# Patient Record
Sex: Female | Born: 2000 | Race: Black or African American | Hispanic: No | Marital: Single | Smoking: Never smoker
Health system: Southern US, Community
[De-identification: ages and names within clinical notes are randomized; demographics above are authoritative.]

## PROBLEM LIST (undated history)

## (undated) DIAGNOSIS — J45909 Unspecified asthma, uncomplicated: Secondary | ICD-10-CM

## (undated) DIAGNOSIS — F909 Attention-deficit hyperactivity disorder, unspecified type: Secondary | ICD-10-CM

## (undated) HISTORY — PX: WISDOM TOOTH EXTRACTION: SHX21

---
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
## 2022-01-21 ENCOUNTER — Encounter: Payer: Self-pay | Admitting: Family

## 2022-01-27 ENCOUNTER — Telehealth: Payer: Self-pay

## 2022-01-27 NOTE — Telephone Encounter (Signed)
Patient's grandmother called stating that the patient (per provider instructions) has been trialing being off of her ADHD medication recently and the grandmother thinks that it is not going well. Per grandma, patient has become increasingly frustrated and agitated as of late and has been participating in school lectures that are 3 hours long that she is not receiving accomadations for. Royann Shivers says that she has spoken to the patient regarding beginning to take her medication again but that the patient has refused. Royann Shivers is requesting that a provider reach out to the patient and encourage her to start taking her medication again. Therapist, music.

## 2022-01-28 ENCOUNTER — Encounter: Payer: Self-pay | Admitting: Family

## 2022-01-28 NOTE — Telephone Encounter (Signed)
Left voice message for Grandmother to call Butch Penny on the nurse line @ [phone] opt 6.Marland Kitchen

## 2022-02-02 ENCOUNTER — Encounter: Payer: Self-pay | Admitting: Family

## 2022-02-02 ENCOUNTER — Telehealth (INDEPENDENT_AMBULATORY_CARE_PROVIDER_SITE_OTHER): Payer: Medicaid Other | Admitting: Family

## 2022-02-02 DIAGNOSIS — R269 Unspecified abnormalities of gait and mobility: Secondary | ICD-10-CM

## 2022-02-02 DIAGNOSIS — F902 Attention-deficit hyperactivity disorder, combined type: Secondary | ICD-10-CM | POA: Diagnosis not present

## 2022-02-02 NOTE — Progress Notes (Signed)
THIS RECORD MAY CONTAIN CONFIDENTIAL INFORMATION THAT SHOULD NOT BE RELEASED WITHOUT REVIEW OF THE SERVICE PROVIDER.  Virtual Follow-Up Visit via Video Note  I connected with Deanna Reilly  on 02/02/22 at  1:30 PM EDT by a video enabled telemedicine application and verified that I am speaking with the correct person using two identifiers.   Patient/parent location: dorm  Provider location: remote [place]   I discussed the limitations of evaluation and management by telemedicine and the availability of in person appointments.  I discussed that the purpose of this telehealth visit is to provide medical care while limiting exposure to the novel coronavirus.  The patient expressed understanding and agreed to proceed.   Deanna Reilly is a 21 y.o. female referred by Rae Lips, MD here today for follow-up of ADHD, combined type, right foot issue.    History was provided by the patient.  Supervising Physician: Dr. Roselind Messier   Plan from Last Visit:   Stopped meds: Intuniv 1 mg and Concerta 36 mg   Chief Complaint: R foot is not straight, walk is different  No concerns off meds   History of Present Illness:  -no heart palpitations, completely went away without taking medications  -now taking art classes, feels like she is not needing the medication with this class load  -sleep is about the same; usually sleeps around 10PM-7AM; sometimes will wake with hunger because of dinner being early around 6PM  -food pretty good; cafeteria kind of lacks in variety; doesn't change up foods often -took a little over a week for stomachache to subside  -since been at school, more consistent BMs than she used to; a little constipation this week  -had breakthrough bleeding, spotting one day  -feels like she has been having sleep paralysis; would know she should be awake but couldn't; getting some headaches; unsure frequency; a couple times in last few days; feels well-rested when  she wakes at 7AM; can never go back to sleep once she is awake  -leg: problem when she walks; never brought it up - left leg and foot walks straight; right leg is slightly out-turned about 15 degrees; if she tries to straighten it, it feels weird; never took notice until recently; no pain in leg or foot; just noticed it was strange   No Known Allergies Outpatient Medications Prior to Visit  Medication Sig Dispense Refill   CONCERTA 36 MG CR tablet Take 1 tablet (36 mg total) by mouth daily. 31 tablet 0   etonogestrel (NEXPLANON) 68 MG IMPL implant 1 each (68 mg total) by Subdermal route once. 1 each 0   guanFACINE (INTUNIV) 1 MG TB24 ER tablet Take 1 tablet (1 mg total) by mouth daily. 180 tablet 0   norethindrone (AYGESTIN) 5 MG tablet Take 1 tablet (5 mg total) by mouth daily. 90 tablet 3   polyethylene glycol powder (GLYCOLAX/MIRALAX) 17 GM/SCOOP powder Take 1-2 capfuls daily for 1 soft bowel movement daily 578 g 6   No facility-administered medications prior to visit.     Patient Active Problem List   Diagnosis Date Noted   Constipation 04/12/2020   Chronic midline thoracic back pain 11/06/2019   Absolute anemia 12/13/2017   ADHD (attention deficit hyperactivity disorder), combined type 09/20/2012   Generalized anxiety disorder 09/20/2012   Sleep disturbance 09/20/2012   The following portions of the patient's history were reviewed and updated as appropriate: allergies, current medications, past medical history, and problem list.  Visual Observations/Objective:   General Appearance:  Well nourished well developed, in no apparent distress.  Eyes: conjunctiva no swelling or erythema ENT/Mouth: No hoarseness, No cough for duration of visit.  Neck: Supple  Respiratory: Respiratory effort normal, normal rate, no retractions or distress.   Cardio: Appears well-perfused, noncyanotic Musculoskeletal: no obvious deformity Skin: visible skin without rashes, ecchymosis, erythema Neuro:  Awake and oriented X 3,  Psych:  normal affect, Insight and Judgment appropriate.    Assessment/Plan: 1. ADHD (attention deficit hyperactivity disorder), combined type -discussed other symptoms of ADHD including irritability and mood changes -plan to log symptoms she describes as sleep paralysis for further evaluation  -consider Neuro or sleep study depending on symptom log  2. Gait abnormality -no antecedent trauma or pain -requesting referral to Ortho for evaluation - Ambulatory referral to Orthopedic Surgery   I discussed the assessment and treatment plan with the patient and/or parent/guardian.  They were provided an opportunity to ask questions and all were answered.  They agreed with the plan and demonstrated an understanding of the instructions. They were advised to call back or seek an in-person evaluation in the emergency room if the symptoms worsen or if the condition fails to improve as anticipated.   Follow-up:   PRN    Georges Mouse, NP    CC: Kalman Jewels, MD, Kalman Jewels, MD

## 2022-02-04 ENCOUNTER — Encounter: Payer: Self-pay | Admitting: Family

## 2022-02-05 ENCOUNTER — Ambulatory Visit: Payer: Medicaid Other | Admitting: Surgery

## 2022-02-12 ENCOUNTER — Ambulatory Visit (INDEPENDENT_AMBULATORY_CARE_PROVIDER_SITE_OTHER): Payer: Medicaid Other | Admitting: Surgery

## 2022-02-12 ENCOUNTER — Encounter: Payer: Self-pay | Admitting: Surgery

## 2022-02-12 DIAGNOSIS — R269 Unspecified abnormalities of gait and mobility: Secondary | ICD-10-CM | POA: Diagnosis not present

## 2022-02-25 NOTE — Progress Notes (Signed)
Office Visit Note   Patient: Deanna Reilly           Date of Birth: 14-Jun-2000           MRN: [id number] Visit Date: 02/12/2022              Requested by: Georges Mouse, NP [address] [place],  [place] [address] PCP: Kalman Jewels, MD   Assessment & Plan: Visit Diagnoses:  1. Abnormal gait     Plan: Advised patient today I do not see any gait abnormality.  Exam is benign.  I will schedule her for formal PT and they can do a better assessment of her gait pattern but again I reassured her that I do not see anything abnormal.  Follow-up in 2 months for recheck.  Follow-Up Instructions: Return in about 2 months (around 04/14/2022) for with [facility] for recheck after PT.   Orders:  Orders Placed This Encounter  Procedures   Ambulatory referral to Physical Therapy   No orders of the defined types were placed in this encounter.     Procedures: No procedures performed   Clinical Data: No additional findings.   Subjective: Chief Complaint  Patient presents with   Right Foot - Pain    HPI 21 year old white female was referred to our office for evaluation of abnormal gait.  Patient states that she has been noticing that her right foot is turned out when she walks.  She does not complain of any back pain or lower extremity pain.  No limitation in activities. Review of Systems No current complaints of cardiopulmonary GI/GU issues  Objective: Vital Signs: There were no vitals taken for this visit.  Physical Exam Constitutional:      Appearance: Normal appearance.  HENT:     Head: Normocephalic and atraumatic.     Nose: Nose normal.  Eyes:     Extraocular Movements: Extraocular movements intact.  Pulmonary:     Effort: No respiratory distress.  Musculoskeletal:     Comments: Gait is normal.  Lumbar spine unremarkable.  Negative logroll bilateral hips.  Negative straight leg raise.  No focal motor deficits.  I do not see any signs of deformity.   Neurological:     General: No focal deficit present.     Mental Status: She is alert and oriented to person, place, and time.     Gait: Gait normal.  Psychiatric:        Mood and Affect: Mood normal.     Ortho Exam  Specialty Comments:  No specialty comments available.  Imaging: No results found.   PMFS History: Patient Active Problem List   Diagnosis Date Noted   Constipation 04/12/2020   Chronic midline thoracic back pain 11/06/2019   Absolute anemia 12/13/2017   ADHD (attention deficit hyperactivity disorder), combined type 09/20/2012   Generalized anxiety disorder 09/20/2012   Sleep disturbance 09/20/2012   Past Medical History:  Diagnosis Date   ADHD    Asthma     Family History  Adopted: Yes    History reviewed. No pertinent surgical history. Social History   Occupational History   Not on file  Tobacco Use   Smoking status: Never   Smokeless tobacco: Never   Tobacco comments:    only rarely when dad visits. no smoke exp on daily basis.  Substance and Sexual Activity   Alcohol use: No    Alcohol/week: 0.0 standard drinks of alcohol   Drug use: No  Sexual activity: Never

## 2022-03-02 DIAGNOSIS — F913 Oppositional defiant disorder: Secondary | ICD-10-CM | POA: Diagnosis not present

## 2022-03-12 DIAGNOSIS — F913 Oppositional defiant disorder: Secondary | ICD-10-CM | POA: Diagnosis not present

## 2022-03-17 DIAGNOSIS — F913 Oppositional defiant disorder: Secondary | ICD-10-CM | POA: Diagnosis not present

## 2022-03-25 DIAGNOSIS — F913 Oppositional defiant disorder: Secondary | ICD-10-CM | POA: Diagnosis not present

## 2022-03-30 DIAGNOSIS — F913 Oppositional defiant disorder: Secondary | ICD-10-CM | POA: Diagnosis not present

## 2022-04-08 DIAGNOSIS — F913 Oppositional defiant disorder: Secondary | ICD-10-CM | POA: Diagnosis not present

## 2022-04-16 DIAGNOSIS — F913 Oppositional defiant disorder: Secondary | ICD-10-CM | POA: Diagnosis not present

## 2022-04-22 DIAGNOSIS — F913 Oppositional defiant disorder: Secondary | ICD-10-CM | POA: Diagnosis not present

## 2022-04-23 ENCOUNTER — Other Ambulatory Visit: Payer: Self-pay | Admitting: Family

## 2022-04-23 ENCOUNTER — Encounter: Payer: Self-pay | Admitting: Family

## 2022-04-23 DIAGNOSIS — N926 Irregular menstruation, unspecified: Secondary | ICD-10-CM

## 2022-04-27 DIAGNOSIS — F913 Oppositional defiant disorder: Secondary | ICD-10-CM | POA: Diagnosis not present

## 2022-05-06 ENCOUNTER — Encounter: Payer: Self-pay | Admitting: Family

## 2022-05-11 DIAGNOSIS — F913 Oppositional defiant disorder: Secondary | ICD-10-CM | POA: Diagnosis not present

## 2022-05-14 ENCOUNTER — Encounter: Payer: Self-pay | Admitting: Family

## 2022-05-19 DIAGNOSIS — F913 Oppositional defiant disorder: Secondary | ICD-10-CM | POA: Diagnosis not present

## 2022-05-28 DIAGNOSIS — F913 Oppositional defiant disorder: Secondary | ICD-10-CM | POA: Diagnosis not present

## 2022-06-07 ENCOUNTER — Ambulatory Visit: Payer: Medicaid Other | Admitting: Family

## 2022-07-12 ENCOUNTER — Ambulatory Visit (INDEPENDENT_AMBULATORY_CARE_PROVIDER_SITE_OTHER): Payer: Medicaid Other | Admitting: Family

## 2022-07-12 ENCOUNTER — Encounter: Payer: Self-pay | Admitting: Family

## 2022-07-12 VITALS — BP 129/76 | HR 70 | Ht 61.52 in | Wt 145.6 lb

## 2022-07-12 DIAGNOSIS — E559 Vitamin D deficiency, unspecified: Secondary | ICD-10-CM

## 2022-07-12 DIAGNOSIS — Z975 Presence of (intrauterine) contraceptive device: Secondary | ICD-10-CM

## 2022-07-12 DIAGNOSIS — Z8349 Family history of other endocrine, nutritional and metabolic diseases: Secondary | ICD-10-CM

## 2022-07-12 DIAGNOSIS — N921 Excessive and frequent menstruation with irregular cycle: Secondary | ICD-10-CM

## 2022-07-12 DIAGNOSIS — Z113 Encounter for screening for infections with a predominantly sexual mode of transmission: Secondary | ICD-10-CM

## 2022-07-12 NOTE — Progress Notes (Signed)
History was provided by the patient.  She is here for breakthrough bleeding with Nexplanon.   PCP confirmed? Yes.    Rae Lips, MD  HPI:   -has been taking Aygestin 5 mg on with Nexplanon to avoid spotting -had bleeding almost the entire month of December and January  -none since that time except for some spotting  -does not want to have period -mom has hx of thyroid issues  -no pain or cramping at this time, no bleeding  -is not taking ADHD medications  -changed major from art to psychology   Patient Active Problem List   Diagnosis Date Noted   Constipation 04/12/2020   Chronic midline thoracic back pain 11/06/2019   Absolute anemia 12/13/2017   ADHD (attention deficit hyperactivity disorder), combined type 09/20/2012   Generalized anxiety disorder 09/20/2012   Sleep disturbance 09/20/2012    Current Outpatient Medications on File Prior to Visit  Medication Sig Dispense Refill   norethindrone (AYGESTIN) 5 MG tablet TAKE 1 TABLET (5 MG TOTAL) BY MOUTH DAILY. 90 tablet 3   polyethylene glycol powder (GLYCOLAX/MIRALAX) 17 GM/SCOOP powder Take 1-2 capfuls daily for 1 soft bowel movement daily [id number] g 6   CONCERTA 36 MG CR tablet Take 1 tablet (36 mg total) by mouth daily. (Patient not taking: Reported on 07/12/2022) 31 tablet 0   etonogestrel (NEXPLANON) 68 MG IMPL implant 1 each (68 mg total) by Subdermal route once. 1 each 0   guanFACINE (INTUNIV) 1 MG TB24 ER tablet Take 1 tablet (1 mg total) by mouth daily. (Patient not taking: Reported on 07/12/2022) 180 tablet 0   No current facility-administered medications on file prior to visit.    No Known Allergies  Physical Exam:    Vitals:   07/12/22 1428  BP: 129/76  Pulse: 70  Weight: 145 lb 9.6 oz (66 kg)  Height: 5' 1.52" (1.563 m)    Blood pressure %iles are not available for patients who are 18 years or older. No LMP recorded.  Physical Exam Constitutional:      General: She is not in acute distress.     Appearance: She is well-developed.  HENT:     Head: Normocephalic and atraumatic.  Eyes:     General: No scleral icterus.    Pupils: Pupils are equal, round, and reactive to light.  Neck:     Thyroid: No thyromegaly.  Cardiovascular:     Rate and Rhythm: Normal rate and regular rhythm.     Heart sounds: Normal heart sounds. No murmur heard. Pulmonary:     Effort: Pulmonary effort is normal.     Breath sounds: Normal breath sounds.  Musculoskeletal:        General: Normal range of motion.     Cervical back: Normal range of motion and neck supple.  Lymphadenopathy:     Cervical: No cervical adenopathy.  Skin:    General: Skin is warm and dry.     Findings: No rash.  Neurological:     Mental Status: She is alert and oriented to person, place, and time.     Cranial Nerves: No cranial nerve deficit.     Motor: No tremor.  Psychiatric:        Attention and Perception: Attention normal.        Mood and Affect: Mood normal.        Speech: Speech normal.        Behavior: Behavior normal.        Thought Content: Thought content  normal.        Judgment: Judgment normal.      Assessment/Plan:  -implant since 2021; now good for 5 years -will assess other etiologies to explain recent bleeding changes including thyroid (due to family hx) and infections. Also addressed that most common side effect with implant is unpredictable bleeding and also discussed withdrawal bleeding associated with norethindrone missed or late dosing. Video in one week to review labs or sooner if needed.   1. Breakthrough bleeding on Nexplanon - TSH + free T4 - Prolactin - CBC with Differential/Platelet - Comprehensive metabolic panel - Hemoglobin A1c - Lipid panel  2. Family history of thyroid disease in mother - TSH + free T4  3. Vitamin D deficiency - VITAMIN D 25 Hydroxy (Vit-D Deficiency, Fractures)  4. Routine screening for STI (sexually transmitted infection) - C. trachomatis/N. gonorrhoeae  RNA

## 2022-07-13 ENCOUNTER — Other Ambulatory Visit: Payer: Self-pay | Admitting: Family

## 2022-07-13 DIAGNOSIS — R7309 Other abnormal glucose: Secondary | ICD-10-CM

## 2022-07-13 LAB — CBC WITH DIFFERENTIAL/PLATELET
Absolute Monocytes: 612 cells/uL (ref 200–950)
Basophils Absolute: 29 cells/uL (ref 0–200)
Basophils Relative: 0.4 %
Eosinophils Absolute: 144 cells/uL (ref 15–500)
Eosinophils Relative: 2 %
HCT: 39 % (ref 35.0–45.0)
Hemoglobin: 12.7 g/dL (ref 11.7–15.5)
Lymphs Abs: 3088.8 cells/uL (ref 850–3900)
MCH: 27.7 pg (ref 27.0–33.0)
MCHC: 32.6 g/dL (ref 32.0–36.0)
MCV: 85.2 fL (ref 80.0–100.0)
MPV: 11 fL (ref 7.5–12.5)
Monocytes Relative: 8.5 %
Neutro Abs: 3326 cells/uL (ref 1500–7800)
Neutrophils Relative %: 46.2 %
Platelets: 268 10*3/uL (ref 140–400)
RBC: 4.58 10*6/uL (ref 3.80–5.10)
RDW: 12.5 % (ref 11.0–15.0)
Total Lymphocyte: 42.9 %
WBC: 7.2 10*3/uL (ref 3.8–10.8)

## 2022-07-13 LAB — COMPREHENSIVE METABOLIC PANEL
AG Ratio: 1.5 (calc) (ref 1.0–2.5)
ALT: 8 U/L (ref 5–32)
AST: 12 U/L (ref 12–32)
Albumin: 4.4 g/dL (ref 3.6–5.1)
Alkaline phosphatase (APISO): 75 U/L (ref 36–128)
BUN: 10 mg/dL (ref 7–20)
CO2: 25 mmol/L (ref 20–32)
Calcium: 9.4 mg/dL (ref 8.9–10.4)
Chloride: 106 mmol/L (ref 98–110)
Creat: 0.84 mg/dL (ref 0.50–0.96)
Globulin: 2.9 g/dL (calc) (ref 2.0–3.8)
Glucose, Bld: 80 mg/dL (ref 65–99)
Potassium: 3.9 mmol/L (ref 3.8–5.1)
Sodium: 140 mmol/L (ref 135–146)
Total Bilirubin: 0.3 mg/dL (ref 0.2–1.1)
Total Protein: 7.3 g/dL (ref 6.3–8.2)

## 2022-07-13 LAB — LIPID PANEL
Cholesterol: 140 mg/dL (ref <170)
HDL: 39 mg/dL — ABNORMAL LOW (ref >45)
LDL Cholesterol (Calc): 82 mg/dL (calc) (ref <110)
Non-HDL Cholesterol (Calc): 101 mg/dL (calc) (ref <120)
Total CHOL/HDL Ratio: 3.6 (calc) (ref <5.0)
Triglycerides: 91 mg/dL — ABNORMAL HIGH (ref <90)

## 2022-07-13 LAB — C. TRACHOMATIS/N. GONORRHOEAE RNA
C. trachomatis RNA, TMA: NOT DETECTED
N. gonorrhoeae RNA, TMA: NOT DETECTED

## 2022-07-13 LAB — VITAMIN D 25 HYDROXY (VIT D DEFICIENCY, FRACTURES): Vit D, 25-Hydroxy: 14 ng/mL — ABNORMAL LOW (ref 30–100)

## 2022-07-13 LAB — HEMOGLOBIN A1C
Hgb A1c MFr Bld: 6 % of total Hgb — ABNORMAL HIGH (ref <5.7)
Mean Plasma Glucose: 126 mg/dL
eAG (mmol/L): 7 mmol/L

## 2022-07-13 LAB — TSH+FREE T4: TSH W/REFLEX TO FT4: 2.1 mIU/L

## 2022-07-13 LAB — PROLACTIN: Prolactin: 12.5 ng/mL

## 2022-07-13 MED ORDER — VITAMIN D (ERGOCALCIFEROL) 1.25 MG (50000 UNIT) PO CAPS: 50000.0000 [IU] | ORAL_CAPSULE | ORAL | 0 refills | Status: DC

## 2022-07-21 ENCOUNTER — Telehealth (INDEPENDENT_AMBULATORY_CARE_PROVIDER_SITE_OTHER): Payer: Medicaid Other | Admitting: Family

## 2022-07-21 ENCOUNTER — Encounter: Payer: Self-pay | Admitting: Family

## 2022-07-21 DIAGNOSIS — R7309 Other abnormal glucose: Secondary | ICD-10-CM | POA: Diagnosis not present

## 2022-07-21 DIAGNOSIS — E559 Vitamin D deficiency, unspecified: Secondary | ICD-10-CM | POA: Diagnosis not present

## 2022-07-21 DIAGNOSIS — Z975 Presence of (intrauterine) contraceptive device: Secondary | ICD-10-CM | POA: Diagnosis not present

## 2022-07-21 DIAGNOSIS — F902 Attention-deficit hyperactivity disorder, combined type: Secondary | ICD-10-CM

## 2022-07-21 DIAGNOSIS — N921 Excessive and frequent menstruation with irregular cycle: Secondary | ICD-10-CM

## 2022-07-21 MED ORDER — CONCERTA 36 MG PO TBCR
36.0000 mg | EXTENDED_RELEASE_TABLET | Freq: Every day | ORAL | 0 refills | Status: DC
Start: 2022-07-21 — End: 2023-01-14

## 2022-07-21 MED ORDER — GUANFACINE HCL ER 1 MG PO TB24
1.0000 mg | ORAL_TABLET | Freq: Every day | ORAL | 0 refills | Status: DC
Start: 2022-07-21 — End: 2023-01-14

## 2022-07-21 NOTE — Progress Notes (Signed)
THIS RECORD MAY CONTAIN CONFIDENTIAL INFORMATION THAT SHOULD NOT BE RELEASED WITHOUT REVIEW OF THE SERVICE PROVIDER.  Virtual Follow-Up Visit via Video Note  I connected with Deanna Reilly  on 07/21/22 at  2:30 PM EDT by a video enabled telemedicine application and verified that I am speaking with the correct person using two identifiers.   Patient/parent location: dorm, Control and instrumentation engineer location: remote [place]    I discussed the limitations of evaluation and management by telemedicine and the availability of in person appointments.  I discussed that the purpose of this telehealth visit is to provide medical care while limiting exposure to the novel coronavirus.  The patient expressed understanding and agreed to proceed.   Deanna Reilly is a 22 y.o. female referred by Kalman Jewels, MD here today for follow-up of elevated A1c, breakthrough bleeding with implant.   History was provided by the patient.  Supervising Physician: Dr. Theadore Nan   Plan from Last Visit:   -implant since 2021; now good for 5 years -will assess other etiologies to explain recent bleeding changes including thyroid (due to family hx) and infections. Also addressed that most common side effect with implant is unpredictable bleeding and also discussed withdrawal bleeding associated with norethindrone missed or late dosing. Video in one week to review labs or sooner if needed.    1. Breakthrough bleeding on Nexplanon - TSH + free T4 - Prolactin - CBC with Differential/Platelet - Comprehensive metabolic panel - Hemoglobin A1c - Lipid panel   2. Family history of thyroid disease in mother - TSH + free T4   3. Vitamin D deficiency - VITAMIN D 25 Hydroxy (Vit-D Deficiency, Fractures)   4. Routine screening for STI (sexually transmitted infection) - C. trachomatis/N. gonorrhoeae RNA  Chief Complaint: Elevated A1c 6.0  Low vitamin D   History of Present Illness:  -Sunday night had  some cramping, no bleeding  -woke up and noticed her spotting had come on  -had some spotting again this morning -did not bleed at all yesterday  -went to gym and started walking  -school food choices are burgers, hot dogs, pizza, pastas - tries to not eat too much  -picked up vitamin d supplementation, started Monday  -plans to keep taking Aygestin until school is out for bleeding and then will see what cycle does without it over the summer  -not taking ADHD medications now but may consider taking them again.  -no other concerns or questions today   No Known Allergies Outpatient Medications Prior to Visit  Medication Sig Dispense Refill   CONCERTA 36 MG CR tablet Take 1 tablet (36 mg total) by mouth daily. (Patient not taking: Reported on 07/12/2022) 31 tablet 0   etonogestrel (NEXPLANON) 68 MG IMPL implant 1 each (68 mg total) by Subdermal route once. 1 each 0   guanFACINE (INTUNIV) 1 MG TB24 ER tablet Take 1 tablet (1 mg total) by mouth daily. (Patient not taking: Reported on 07/12/2022) 180 tablet 0   norethindrone (AYGESTIN) 5 MG tablet TAKE 1 TABLET (5 MG TOTAL) BY MOUTH DAILY. 90 tablet 3   polyethylene glycol powder (GLYCOLAX/MIRALAX) 17 GM/SCOOP powder Take 1-2 capfuls daily for 1 soft bowel movement daily 578 g 6   Vitamin D, Ergocalciferol, (DRISDOL) 1.25 MG (50000 UNIT) CAPS capsule Take 1 capsule (50,000 Units total) by mouth every 7 (seven) days. 8 capsule 0   No facility-administered medications prior to visit.     Patient Active Problem List   Diagnosis Date Noted  Constipation 04/12/2020   Chronic midline thoracic back pain 11/06/2019   Absolute anemia 12/13/2017   ADHD (attention deficit hyperactivity disorder), combined type 09/20/2012   Generalized anxiety disorder 09/20/2012   Sleep disturbance 09/20/2012     The following portions of the patient's history were reviewed and updated as appropriate: allergies, current medications, past family history, past medical  history, past social history, past surgical history, and problem list.  Visual Observations/Objective:   General Appearance: Well nourished well developed, in no apparent distress.  Eyes: conjunctiva no swelling or erythema ENT/Mouth: No hoarseness, No cough for duration of visit.  Neck: Supple  Respiratory: Respiratory effort normal, normal rate, no retractions or distress.   Cardio: Appears well-perfused, noncyanotic Musculoskeletal: no obvious deformity Skin: visible skin without rashes, ecchymosis, erythema Neuro: Awake and oriented X 3,  Psych:  normal affect, Insight and Judgment appropriate.    Assessment/Plan: 1. Breakthrough bleeding on Nexplanon -reviewed labs - thyroid and prolactin WNL  -hemoglobin is 12.7 -continue with Aygestin for BTB; return precautions reviewed   2. Elevated hemoglobin A1c -scheduled for RD appointment 6/5 -reviewed lifestyle modifications including daily body movements, increase walking; limit fatty foods, increase fresh fruits and vegetables; cholesterol is WNL.   3. Vitamin D deficiency -continue high dose vitamin D (14)   Repeat A1c in 3 months.  Refills for ADHD medications sent today; she may consider restarting them.   I discussed the assessment and treatment plan with the patient and/or parent/guardian.  They were provided an opportunity to ask questions and all were answered.  They agreed with the plan and demonstrated an understanding of the instructions. They were advised to call back or seek an in-person evaluation in the emergency room if the symptoms worsen or if the condition fails to improve as anticipated.   Follow-up:   3 months or sooner if needed   Georges Mouse, NP    CC: Kalman Jewels, MD, Kalman Jewels, MD

## 2022-07-29 ENCOUNTER — Encounter: Payer: Self-pay | Admitting: *Deleted

## 2022-09-15 ENCOUNTER — Encounter: Payer: Medicaid Other | Attending: Family | Admitting: Dietician

## 2022-09-15 ENCOUNTER — Encounter: Payer: Self-pay | Admitting: Dietician

## 2022-09-15 VITALS — Ht 61.5 in | Wt 152.0 lb

## 2022-09-15 DIAGNOSIS — R7309 Other abnormal glucose: Secondary | ICD-10-CM

## 2022-09-15 NOTE — Patient Instructions (Addendum)
Goal: Go walking 4-5 days per week for 15 minutes.  Goal: Aim to make your plate look like MyPlate at least once per day.   Goal: Aim to drink 2 water bottles daily (we continue to work up from there).   Rethink what you drink. Choose beverages without added sugar. Look for 0 carbs on the label.

## 2022-09-15 NOTE — Progress Notes (Signed)
Medical Nutrition Therapy  Appointment Start time:  1450  Appointment End time:  1542  Primary concerns today: Pt is concerned about her A1c level.    Referral diagnosis: elevated hemoglobin a1c Preferred learning style: no preference indicated Learning readiness: ready   NUTRITION ASSESSMENT   Anthropometrics  Ht: 61.5 in Wt: 152 lbs  Clinical Medical Hx: vitamin D deficiency, prediabetes, asthma Medications: reviewed Labs: 07/12/22 A1c 6.0%, HDL 39, Trig 91, Vitamin D 14.  Notable Signs/Symptoms: constipation and diarrhea Food Allergies: none reported  Lifestyle & Dietary Hx  Pt is present today with her grandma.   Pt states sometimes she goes a week without a bowel movement and then she will take miralax. She states in the last few days she has experienced some diarrhea.   Pt states she drinks one 16 oz bottle of water daily with her medication but otherwise does not drink any because she does not like it.   Pt states her grandma cooks rarely and states she does not know how to cook much so they mostly get takeout food.   Pt states she gets heartburn after dinner sometimes.   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   Pt state before the school year ended she was walking to the gym and working out but has since stopped since she is not on campus anymore.   Preferred vegetables: kale, asparagus, spinach, broccoli, cauliflower, cucumber, green beans, collards, onion, zucchini.   Estimated daily fluid intake: 16 oz Supplements: vitamin D Sleep: 8 hours Stress / self-care: low stress Current average weekly physical activity: ADLs  24-Hr Dietary Recall First Meal: 2 buttered waffles OR 2 toast with butter Snack: white cheddar cheeto puffs OR chips Second Meal: mcdonalds OR wendys OR sandwich OR tater tots Snack: white cheddar cheeto puffs OR chips Third Meal: salmon with rice and broccoli OR hibachi vegetables and rice OR veggie lo mein Snack:  croissant Beverages: 16 oz, 1-2 cups cran-mango juice, 1 soda mini can    NUTRITION DIAGNOSIS  -2.2 Altered nutrition-related laboratory As related to prediabetes.  As evidenced by A1c 6.0.   NUTRITION INTERVENTION  Nutrition education (E-1) on the following topics:  Fruits & Vegetables: Aim to fill half your plate with a variety of fruits and vegetables. They are rich in vitamins, minerals, and fiber, and can help reduce the risk of chronic diseases. Choose a colorful assortment of fruits and vegetables to ensure you get a wide range of nutrients. Grains and Starches: Make at least half of your grain choices whole grains, such as brown rice, whole wheat bread, and oats. Whole grains provide fiber, which aids in digestion and healthy cholesterol levels. Aim for whole forms of starchy vegetables such as potatoes, sweet potatoes, beans, peas, and corn, which are fiber rich and provide many vitamins and minerals.  Protein: Incorporate lean sources of protein, such as poultry, fish, beans, nuts, and seeds, into your meals. Protein is essential for building and repairing tissues, staying full, balancing blood sugar, as well as supporting immune function. Dairy: Include low-fat or fat-free dairy products like milk, yogurt, and cheese in your diet. Dairy foods are excellent sources of calcium and vitamin D, which are crucial for bone health.   Prediabetes: Prediabetes is a condition where blood sugar levels are higher than normal but not yet high enough to be diagnosed as type 2 diabetes. A1C, or hemoglobin A1c, is a blood test that provides an average of a person's blood sugar levels over the  past two to three months. It is commonly used to diagnose and monitor diabetes. For prediabetes, an A1C level between 5.7% and 6.4% typically is used to diagnose this. Here is how the A1C levels are generally categorized: Normal:  A1C below 5.7% Prediabetes:  A1C between 5.7% and 6.4% Diabetes:  A1C of 6.5% or  higher When diagnosed with prediabetes, there are several lifestyle changes you can make to manage the condition: Healthy Eating:  Follow a well-balanced diet that includes a variety of fruits, vegetables, whole grains, lean proteins, and healthy fats. Monitor portion sizes and reduce intake of sugary and processed foods. Regular Physical Activity:  Engage in regular physical activity, such as brisk walking, cycling, or other aerobic exercises, for at least 150 minutes per week. Include strength training exercises at least twice a week.  Handouts Provided Include  Plate Method  Learning Style & Readiness for Change Teaching method utilized: Visual & Auditory  Demonstrated degree of understanding via: Teach Back  Barriers to learning/adherence to lifestyle change: none  Goals Established by Pt  Goal: Go walking 4-5 days per week for 15 minutes.  Goal: Aim to make your plate look like MyPlate at least once per day.   Goal: Aim to drink 2 water bottles daily (we continue to work up from there).   Rethink what you drink. Choose beverages without added sugar. Look for 0 carbs on the label.   MONITORING & EVALUATION Dietary intake, weekly physical activity, and follow up PRN.  Next Steps  Patient is to call for questions.

## 2022-10-20 DIAGNOSIS — F913 Oppositional defiant disorder: Secondary | ICD-10-CM | POA: Diagnosis not present

## 2022-10-30 ENCOUNTER — Encounter: Payer: Self-pay | Admitting: Family

## 2022-10-31 ENCOUNTER — Other Ambulatory Visit: Payer: Self-pay | Admitting: Family

## 2022-10-31 MED ORDER — POLYETHYLENE GLYCOL 3350 17 GM/SCOOP PO POWD: ORAL | 6 refills | Status: DC

## 2022-11-17 DIAGNOSIS — F913 Oppositional defiant disorder: Secondary | ICD-10-CM | POA: Diagnosis not present

## 2022-12-15 ENCOUNTER — Encounter: Payer: Self-pay | Admitting: Student

## 2022-12-15 ENCOUNTER — Ambulatory Visit: Payer: Medicaid Other | Admitting: Student

## 2022-12-15 VITALS — BP 118/64 | HR 74 | Ht 61.65 in | Wt 155.5 lb

## 2022-12-15 DIAGNOSIS — Z3202 Encounter for pregnancy test, result negative: Secondary | ICD-10-CM

## 2022-12-15 DIAGNOSIS — R0602 Shortness of breath: Secondary | ICD-10-CM

## 2022-12-15 LAB — POCT URINE PREGNANCY: Preg Test, Ur: NEGATIVE

## 2022-12-15 LAB — POCT HEMOGLOBIN: Hemoglobin: 10.9 g/dL — AB (ref 11–14.6)

## 2022-12-15 MED ORDER — ALBUTEROL SULFATE HFA 108 (90 BASE) MCG/ACT IN AERS: 2.0000 | INHALATION_SPRAY | Freq: Four times a day (QID) | RESPIRATORY_TRACT | 2 refills | Status: DC | PRN

## 2022-12-15 NOTE — Patient Instructions (Addendum)
Staying hydrated means making sure that your body has enough fluids to work well. It is especially important to stay hydrated when you exercise and play sports. This keeps you from losing more fluids than you take in (dehydration). Dehydration is more likely to happen if you exercise very hard and sweat a lot. You can keep yourself hydrated during sports and other activities by: Drinking enough water before, during, and after physical activity. Watching out for signs of dehydration. Why is staying hydrated important? Staying hydrated is important because your body contains mostly water. Your body needs fluid to work properly. Drinking enough fluid helps your body: Carry important nutrients, oxygen, and blood to all the cells in your body. Perform at its best athletically by: Maintaining a proper body temperature (thermoregulation). Keeping your joints moving smoothly. Helping your heart pump blood more efficiently. Making your muscles work better. Staying hydrated is also important because it helps prevent dehydration. Severe dehydration can be a medical emergency. How can I know if I am not hydrated enough? Checking the color of your urine is the easiest way to tell if you are hydrated enough. Dark yellow urine shows that you need more fluids. Your urine should be pale yellow. Watch for signs of dehydration. These include: Feeling thirsty. Dry lips or dry mouth. Urinating less often or having very dark urine. Muscle cramps. Headache. Fast heartbeat. Dizziness. Feeling very tired. Not having tears when crying. You can also weigh yourself before and after being physically active. The difference in your weight shows how much fluid you lost. For every pound lost, drink 16-24 oz of water to prevent dehydration. What actions can I take to stay hydrated? The best way to stay hydrated is by drinking water before, during, and after exercising and playing sports. In general, you should  drink: 16-20 oz of water 2-3 hours before being active. 8 oz of water 30 minutes before being active. 4-8 oz of water every 15-20 minutes during physical activity. 8 oz of water within 30 minutes after you finish being active. Drinking water is usually enough to keep you hydrated. Most teens do not need sports drinks. You may need a sports drink if you exercise or play outdoors in extremely hot weather for longer than one hour. Choose a sports drink that does not have caffeine or a lot of sugar. Get help right away if you have: Signs of heat exhaustion or heatstroke, including: Headache. Weakness and fatigue. Nausea or vomiting. Passing out. Fast heartbeat. Fast breathing. Body temperature higher than 104F (40C). Feeling confused. Seizures. Signs of severe dehydration, such as: Feeling weak and confused. Dry skin. Not urinating. Passing out. Signs of too little salt (sodium) in the body, such as: Swollen hands and feet. Feeling confused. Vomiting. Headache. These symptoms may be an emergency. Get help right away. Call 911. Do not wait to see if the symptoms will go away. Do not drive yourself to the hospital. Summary It is important to stay hydrated when doing physical activity so that you do not get dehydrated. Drinking enough fluid to stay hydrated can help you perform your best athletically and prevent dehydration. You can check to see if you are hydrated enough by checking the color of your urine, watching for signs of dehydration, and weighing yourself before and after physical activity. Drinking water is usually enough to keep you hydrated. Most teens do not need sports drinks. This information is not intended to replace advice given to you by your health care provider. Make  sure you discuss any questions you have with your health care provider. Document Revised: 07/28/2021 Document Reviewed: 07/28/2021 Elsevier Patient Education  2024 [employer].  Iron Rich  Foods  Give foods that are high in iron such as meats, fish, beans, eggs, dark leafy greens (kale, spinach), and fortified cereals (Cheerios, Oatmeal Squares, Mini Wheats).    Eating these foods along with a food containing vitamin C (such as oranges or strawberries) helps the body to absorb the iron.   Give an infant multivitamin with iron such as Poly-vi-sol with iron daily.  This doesn't taste great and can stain skin and clothing.  Consider giving it during the bath.  Novaferrum Pediatric Drops Multivitamin with Iron is a better-tasting alternative that you can order on the internet.  For children older than age 79, give a chewable multivitamin with iron (such as Flintstones with Iron) one vitamin daily.  Milk is very nutritious, but limit the amount of milk to no more than 16-20 oz per day.   Best Cereal Choices: Contain 90-100% of daily recommended iron.   All flavors of Oatmeal Squares, Multi-grain cheerios, and Mini Wheats are high in iron.         Next best cereal choices: Contain 45-50% of daily recommended iron.  Original cheerios are high in iron - other flavors are not.   Original Rice Krispies and original Kix are also high in iron, other flavors are not.

## 2022-12-15 NOTE — Progress Notes (Signed)
PCP: Kalman Jewels, MD   Chief Complaint  Patient presents with   Shortness of Breath    PATIENT STATES FOR THE LAST COUPLE OF DAYS SHE HAS BEEN HAVING SHORTNESS OF BREATH SOME NAUSEA AND DIZZINESS NO FEVER        Subjective:  HPI:  Deanna Reilly is a 22 y.o. female with PMHx of asthma requiring albuterol who is experiencing dizziness, lightheadedness, nausea, headache which has worsened at night over the last few weeks. During the day until 3pm, she does not notice it. After classes end and when she's in her room working on homework, she notices the onset. Ibuprofen helps with her headache. Does feel like laying down helps. She feels short of breath walking to class when outdoors and in the heat, and like she's not getting enough breath for lungs to be satisfied. Did feel similarly out of breath when she was anemic. No cough, wheeze, stridor, chest tightness. No tingling of arms or hands.   She goes to bed at 11-12pm and wakes up at 7-8am. Does take longer naps in the afternoon.   She tries to eat breakfast. Occasionally will skip breakfast and will eat only a chewy bar, which has happened a lot in the last week. Doesn't eat lunch on M/W because her classes begin at 12 and end at 3/4pm. T/TR will eat CFA, hibachi, wings and fries for lunch. Dinner is sometimes leftovers of lunch CFA, fries, hibachi and jimmy johns. Drinks one 16 ounce water bottle daily, but occasionally does not have any water all day. Last few weeks has had decreased water intake.   Does not do any sports or exercises daily. Will go to the gym on Friday's an wants to go 1-2x a week. Does enjoy softball and wanted to join the softball club. 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   Has not had a period recently. Does have spotting. Has Nexplanon and takes Aygestin daily. Would be interested in stopping Aygestin, but is worried about having heavy periods again.   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 Her meal plan has also changed and is much more restrictive than it was last year.   REVIEW OF SYSTEMS:  As per HPI.    Meds: Current Outpatient Medications  Medication Sig Dispense Refill   albuterol (VENTOLIN HFA) 108 (90 Base) MCG/ACT inhaler Inhale 2 puffs into the lungs every 6 (six) hours as needed for wheezing or shortness of breath. 8 g 2   etonogestrel (NEXPLANON) 68 MG IMPL implant 1 each (68 mg total) by Subdermal route once. 1 each 0   norethindrone (AYGESTIN) 5 MG tablet TAKE 1 TABLET (5 MG TOTAL) BY MOUTH DAILY. 90 tablet 3   polyethylene glycol powder (GLYCOLAX/MIRALAX) 17 GM/SCOOP powder Take 1-2 capfuls daily for 1 soft bowel movement daily 578 g 6   CONCERTA 36 MG CR tablet Take 1 tablet (36 mg total) by mouth daily. 30 tablet 0   guanFACINE (INTUNIV) 1 MG TB24 ER tablet Take 1 tablet (1 mg total) by mouth daily. (Patient not taking: Reported on 12/15/2022) 90 tablet 0   Vitamin D, Ergocalciferol, (DRISDOL) 1.25 MG (50000 UNIT) CAPS capsule Take 1 capsule (50,000 Units total) by mouth every 7 (seven) days. (Patient not taking: Reported on 12/15/2022) 8 capsule 0   No current facility-administered medications for this visit.    ALLERGIES: No Known Allergies  PMH:  Past Medical History:  Diagnosis Date   ADHD    Asthma     PSH: No past surgical history on file.  Social history:  Social History   Social History Narrative   ** Merged History Encounter **        Family history: Family History  Adopted: Yes     Objective:   Physical Examination:  Temp:   Pulse: 74 BP: 118/64 (Blood pressure %iles are not available for patients who are 18 years or older.)  Wt: 155 lb 8 oz (70.5 kg)  Ht: 5' 1.65" (1.566 m)  BMI: Body mass index is 28.76 kg/m. (91 %ile (Z= 1.31) based on CDC (Girls, 2-20 Years) BMI-for-age based on BMI available on 09/15/2022 from contact on 09/15/2022.) GENERAL: Well appearing, no distress HEENT:  NCAT, clear sclerae, TMs normal bilaterally, no nasal discharge, MMM NECK: Supple, no cervical LAD LUNGS: EWOB, CTAB, no wheeze, no crackles CARDIO: RRR, normal S1S2 no murmur, well perfused, capillary refill <2 seconds ABDOMEN: Normoactive bowel sounds, soft, ND/NT, no masses or organomegaly EXTREMITIES: Warm and well perfused, no deformity NEURO: Awake, alert, interactive, normal strength, tone, sensation, and gait SKIN: No rash, ecchymosis or petechiae   Assessment/Plan:   Deanna Reilly is a 22 y.o. old female here for intermittent dizziness, nausea and lightheadedness, occasional headache and shortness of breath likely related to a combination of mild anemia, dehydration, sporadic caloric intake, and exacerbation of underlying asthma/normal physiologic limitation caused by inactivity. Requested that the patient initiate iron supplementation, use albuterol inhaler PRN for dyspnea and start preventative healthy lifestyle changes (increasing water intake by 3x current daily amount, regular healthier meals with snacks between, more workouts at the gym).   1. Shortness of breath Plan to have patient follow-up with our clinic in one month for symptom monitoring. Also discussed transitioning to adult medicine today. Will forward her chart to SW today.  - POCT urine pregnancy - POCT hemoglobin - albuterol (VENTOLIN HFA) 108 (90 Base) MCG/ACT inhaler; Inhale 2 puffs into the lungs every 6 (six) hours as needed for wheezing or shortness of breath.  Dispense: 8 g; Refill: 2  Follow up: Return for in one month with Bernell List, NP .   Belia Heman, MD [facility], PGY-2 12/15/2022 12:20 PM

## 2022-12-30 DIAGNOSIS — F913 Oppositional defiant disorder: Secondary | ICD-10-CM | POA: Diagnosis not present

## 2022-12-31 ENCOUNTER — Encounter: Payer: Self-pay | Admitting: Pediatrics

## 2023-01-14 ENCOUNTER — Encounter: Payer: Self-pay | Admitting: Family

## 2023-01-14 ENCOUNTER — Telehealth (INDEPENDENT_AMBULATORY_CARE_PROVIDER_SITE_OTHER): Payer: Medicaid Other | Admitting: Family

## 2023-01-14 ENCOUNTER — Ambulatory Visit: Payer: Medicaid Other | Admitting: Family

## 2023-01-14 VITALS — BP 118/68 | Ht 61.42 in | Wt 159.4 lb

## 2023-01-14 DIAGNOSIS — K5900 Constipation, unspecified: Secondary | ICD-10-CM | POA: Diagnosis not present

## 2023-01-14 DIAGNOSIS — F902 Attention-deficit hyperactivity disorder, combined type: Secondary | ICD-10-CM | POA: Diagnosis not present

## 2023-01-14 MED ORDER — METHYLPHENIDATE HCL 5 MG PO TABS: 5.0000 mg | ORAL_TABLET | Freq: Two times a day (BID) | ORAL | 0 refills | Status: DC

## 2023-01-14 NOTE — Progress Notes (Signed)
THIS RECORD MAY CONTAIN CONFIDENTIAL INFORMATION THAT SHOULD NOT BE RELEASED WITHOUT REVIEW OF THE SERVICE PROVIDER.  Virtual Follow-Up Visit via Video Note  I connected with Deanna Reilly   on 01/14/23 at 11:00 AM EDT by a video enabled telemedicine application and verified that I am speaking with the correct person using two identifiers.   Patient/parent location: Clinical research associate location: remote [place]    I discussed the limitations of evaluation and management by telemedicine and the availability of in person appointments.  I discussed that the purpose of this telehealth visit is to provide medical care while limiting exposure to the novel coronavirus.  The patient expressed understanding and agreed to proceed.   Deanna Reilly is a 22 y.o. female referred by Kalman Jewels, MD here today for follow-up of ADHD.   History was provided by the patient.  Supervising Physician: Dr. Theadore Nan    Chief Complaint: Constipation Needs to focus in one of her classes  History of Present Illness:  -currently an RA in dorm, so  -doesn't eat as much, hurts often at night  -has been using miralax; feels urge to go but can't  -using prune juice this morning -thinks she had hemorrhoids at some point; noticed it after sitting on toilet for 40 minutes, external, no bleeding but moved from external to internal  -1 capful will make her bloated and feel like she has to go but never really does  -no bleeding, no mucous -no other symptoms or concerns  -needs focus in psychiatry class; does not really want to take an all-day coverage med like she did before, not sure about trying new medication   No Known Allergies Outpatient Medications Prior to Visit  Medication Sig Dispense Refill   albuterol (VENTOLIN HFA) 108 (90 Base) MCG/ACT inhaler Inhale 2 puffs into the lungs every 6 (six) hours as needed for wheezing or shortness of breath. 8 g 2   CONCERTA 36 MG CR tablet Take 1  tablet (36 mg total) by mouth daily. 30 tablet 0   etonogestrel (NEXPLANON) 68 MG IMPL implant 1 each (68 mg total) by Subdermal route once. 1 each 0   guanFACINE (INTUNIV) 1 MG TB24 ER tablet Take 1 tablet (1 mg total) by mouth daily. (Patient not taking: Reported on 12/15/2022) 90 tablet 0   norethindrone (AYGESTIN) 5 MG tablet TAKE 1 TABLET (5 MG TOTAL) BY MOUTH DAILY. 90 tablet 3   polyethylene glycol powder (GLYCOLAX/MIRALAX) 17 GM/SCOOP powder Take 1-2 capfuls daily for 1 soft bowel movement daily 578 g 6   Vitamin D, Ergocalciferol, (DRISDOL) 1.25 MG (50000 UNIT) CAPS capsule Take 1 capsule (50,000 Units total) by mouth every 7 (seven) days. (Patient not taking: Reported on 12/15/2022) 8 capsule 0   No facility-administered medications prior to visit.     Patient Active Problem List   Diagnosis Date Noted   Constipation 04/12/2020   Chronic midline thoracic back pain 11/06/2019   Absolute anemia 12/13/2017   ADHD (attention deficit hyperactivity disorder), combined type 09/20/2012   Generalized anxiety disorder 09/20/2012   Sleep disturbance 09/20/2012   The following portions of the patient's history were reviewed and updated as appropriate: allergies, current medications, past family history, past medical history, past social history, past surgical history, and problem list.  Visual Observations/Objective:   General Appearance: Well nourished well developed, in no apparent distress.  Eyes: conjunctiva no swelling or erythema ENT/Mouth: No hoarseness, No cough for duration of visit.  Neck: Supple  Respiratory:  Respiratory effort normal, normal rate, no retractions or distress.   Cardio: Appears well-perfused, noncyanotic Musculoskeletal: no obvious deformity Skin: visible skin without rashes, ecchymosis, erythema Neuro: Awake and oriented X 3,  Psych:  normal affect, Insight and Judgment appropriate.    Assessment/Plan: 1. ADHD (attention deficit hyperactivity disorder),  combined type -discussed short acting methylphenidate option  -reviewed side effects, expected effects -return precautions reviewed   2. Constipation, unspecified constipation type -discussed clean out and instructions sent to My Chart  -advised to avoid sitting on toilet for long periods of time -return precautions reviewed  -hold on iron supplement until next Hgb check (not checked when she was in office today)  -return in one month or sooner if needed   I discussed the assessment and treatment plan with the patient and/or parent/guardian.  They were provided an opportunity to ask questions and all were answered.  They agreed with the plan and demonstrated an understanding of the instructions. They were advised to call back or seek an in-person evaluation in the emergency room if the symptoms worsen or if the condition fails to improve as anticipated.   Follow-up:   one month in person, recheck hgb   Georges Mouse, NP    CC: Kalman Jewels, MD, Kalman Jewels, MD

## 2023-01-14 NOTE — Progress Notes (Signed)
Changed to virtual.   Wt Readings from Last 3 Encounters:  01/14/23 159 lb 6 oz (72.3 kg) (87%, Z= 1.11)*  12/15/22 155 lb 8 oz (70.5 kg) (84%, Z= 1.01)*  09/15/22 152 lb (68.9 kg) (82%, Z= 0.92)*   * Growth percentiles are based on CDC (Girls, 2-20 Years) data.   BP Readings from Last 3 Encounters:  01/14/23 118/68  12/15/22 118/64  07/12/22 129/76   Pulse Readings from Last 3 Encounters:  12/15/22 74  07/12/22 70  11/03/21 78

## 2023-01-20 DIAGNOSIS — F913 Oppositional defiant disorder: Secondary | ICD-10-CM | POA: Diagnosis not present

## 2023-01-26 ENCOUNTER — Telehealth: Payer: Self-pay | Admitting: *Deleted

## 2023-01-26 NOTE — Telephone Encounter (Signed)
Patient is having crying episodes and grandma concerned that she needs the Concerta. Has a therapy appt next week 10/23, then will call for appointment if therapist recommends to restart the Concerta.

## 2023-01-26 NOTE — Telephone Encounter (Signed)
Izela's Grandmother wants a call back about [place] from [facility]. [phone].

## 2023-01-31 ENCOUNTER — Encounter: Payer: Self-pay | Admitting: Family

## 2023-02-10 DIAGNOSIS — F913 Oppositional defiant disorder: Secondary | ICD-10-CM | POA: Diagnosis not present

## 2023-02-17 ENCOUNTER — Ambulatory Visit: Payer: Medicaid Other | Admitting: Family

## 2023-02-17 ENCOUNTER — Encounter: Payer: Self-pay | Admitting: Family

## 2023-02-17 VITALS — BP 123/79 | HR 72 | Ht 61.42 in | Wt 160.8 lb

## 2023-02-17 DIAGNOSIS — E611 Iron deficiency: Secondary | ICD-10-CM | POA: Diagnosis not present

## 2023-02-17 DIAGNOSIS — K5900 Constipation, unspecified: Secondary | ICD-10-CM

## 2023-02-17 DIAGNOSIS — F902 Attention-deficit hyperactivity disorder, combined type: Secondary | ICD-10-CM | POA: Diagnosis not present

## 2023-02-17 DIAGNOSIS — Z1331 Encounter for screening for depression: Secondary | ICD-10-CM | POA: Diagnosis not present

## 2023-02-17 LAB — POCT HEMOGLOBIN: Hemoglobin: 12.9 g/dL (ref 11–14.6)

## 2023-02-17 MED ORDER — HYDROXYZINE HCL 25 MG PO TABS: 25.0000 mg | ORAL_TABLET | Freq: Every evening | ORAL | 2 refills | Status: DC | PRN

## 2023-02-17 NOTE — Progress Notes (Signed)
History was provided by the patient and grandmother.  Deanna Reilly is a 22 y.o. female who is here for ADHD, constipation.   PCP confirmed? Yes.    Kalman Jewels, MD  Plan from last visit:   1. ADHD (attention deficit hyperactivity disorder), combined type -discussed short acting methylphenidate option  -reviewed side effects, expected effects -return precautions reviewed    2. Constipation, unspecified constipation type -discussed clean out and instructions sent to My Chart  -advised to avoid sitting on toilet for long periods of time -return precautions reviewed  -hold on iron supplement until next Hgb check (not checked when she was in office today)  -return in one month or sooner if needed   HPI:   -ADHD meds only tried once since she had them  -only tried it because she knew that she was coming back  -though it would be more notable effect; was on medication, felt like it cleared her thoughts; took it before her English class and had worn off by psychology class   -back pain, trapped gas; will start in her back inconsistent pain will feel like body is crushed; usually happens at night time; will take ibuprofen to help try to go to sleep  -noticeable trend, 3-4 days then will go away  -issues with acid reflux, GERD; takes TUMs  -talked to therapist; recommended anxiety  -hydroxyzine L Dr Toniann Fail   -fuzzy feeling blooming in back of head; got up next morning had trouble on her feet; sitting down could not see things spinning but things were slightly swaying; a little bit this morning; ADHD med was not taken this morning   -drinks one bottle of water daily   -Nexplanon: wants to get the renewed before next year   -2 surgeries: breast reduction; wants to take out her uterus because she does not want to deal with her period although her nexplanon is managing her period      02/20/2023    9:12 AM 09/15/2022    2:54 PM 10/20/2021    4:59 PM  PHQ-SADS Last 3 Score  only  PHQ-15 Score 5  5  Total GAD-7 Score 6  1  PHQ Adolescent Score  0    ASRS Completed on 02/20/23 Part A:  3/6 Part B:  4/12   Patient Active Problem List   Diagnosis Date Noted   Constipation 04/12/2020   Chronic midline thoracic back pain 11/06/2019   Absolute anemia 12/13/2017   ADHD (attention deficit hyperactivity disorder), combined type 09/20/2012   Generalized anxiety disorder 09/20/2012   Sleep disturbance 09/20/2012    Current Outpatient Medications on File Prior to Visit  Medication Sig Dispense Refill   albuterol (VENTOLIN HFA) 108 (90 Base) MCG/ACT inhaler Inhale 2 puffs into the lungs every 6 (six) hours as needed for wheezing or shortness of breath. 8 g 2   etonogestrel (NEXPLANON) 68 MG IMPL implant 1 each (68 mg total) by Subdermal route once. 1 each 0   methylphenidate (RITALIN) 5 MG tablet Take 1 tablet (5 mg total) by mouth 2 (two) times daily. 60 tablet 0   norethindrone (AYGESTIN) 5 MG tablet TAKE 1 TABLET (5 MG TOTAL) BY MOUTH DAILY. 90 tablet 3   polyethylene glycol powder (GLYCOLAX/MIRALAX) 17 GM/SCOOP powder Take 1-2 capfuls daily for 1 soft bowel movement daily 578 g 6   No current facility-administered medications on file prior to visit.    No Known Allergies  Physical Exam:    Vitals:   02/17/23 1600  BP: 123/79  Pulse: 72  Weight: 160 lb 12.8 oz (72.9 kg)  Height: 5' 1.42" (1.56 m)   Wt Readings from Last 3 Encounters:  02/17/23 160 lb 12.8 oz (72.9 kg) (87%, Z= 1.15)*  01/14/23 159 lb 6 oz (72.3 kg) (87%, Z= 1.11)*  12/15/22 155 lb 8 oz (70.5 kg) (84%, Z= 1.01)*   * Growth percentiles are based on CDC (Girls, 2-20 Years) data.     Blood pressure %iles are not available for patients who are 18 years or older. No LMP recorded.  Physical Exam Constitutional:      General: She is not in acute distress.    Appearance: She is well-developed.  HENT:     Head: Normocephalic and atraumatic.  Eyes:     General: No scleral  icterus.    Pupils: Pupils are equal, round, and reactive to light.  Neck:     Thyroid: No thyromegaly.  Cardiovascular:     Rate and Rhythm: Normal rate and regular rhythm.     Heart sounds: Normal heart sounds. No murmur heard. Pulmonary:     Effort: Pulmonary effort is normal.     Breath sounds: Normal breath sounds.  Abdominal:     Palpations: Abdomen is soft.  Musculoskeletal:        General: Normal range of motion.     Cervical back: Normal range of motion and neck supple.  Lymphadenopathy:     Cervical: No cervical adenopathy.  Skin:    General: Skin is warm and dry.     Findings: No rash.  Neurological:     Mental Status: She is alert and oriented to person, place, and time.     Cranial Nerves: No cranial nerve deficit.     Sensory: No sensory deficit.     Motor: No tremor.  Psychiatric:        Mood and Affect: Mood is anxious.        Speech: Speech normal.        Behavior: Behavior normal.        Thought Content: Thought content normal.        Judgment: Judgment normal.     Lab Results  Component Value Date   HGB 12.9 02/17/2023    Assessment/Plan: 1. Iron deficiency Hgb improved since September; normalized - POCT hemoglobin  2. ADHD (attention deficit hyperactivity disorder), combined type -advised to try Ritalin 5 mg twice daily with food for longer period of time so we can assess symptom management; discussed this is a low dose and may need to be titrated up; can increase to 10 mg in AM and 5 or 10 mg in PM; advised to report new or worsening symptoms; return precautions reviewed; 2-3 weeks video follow up or sooner if needed   3. Constipation, unspecified constipation type -recommended continual daily dose; increase water intake

## 2023-02-17 NOTE — Progress Notes (Signed)
L 

## 2023-02-20 ENCOUNTER — Encounter: Payer: Self-pay | Admitting: Family

## 2023-03-02 ENCOUNTER — Telehealth: Payer: Medicaid Other | Admitting: Family

## 2023-03-02 ENCOUNTER — Encounter: Payer: Self-pay | Admitting: Family

## 2023-03-02 DIAGNOSIS — G479 Sleep disorder, unspecified: Secondary | ICD-10-CM

## 2023-03-02 DIAGNOSIS — K5900 Constipation, unspecified: Secondary | ICD-10-CM

## 2023-03-02 DIAGNOSIS — F902 Attention-deficit hyperactivity disorder, combined type: Secondary | ICD-10-CM | POA: Diagnosis not present

## 2023-03-02 NOTE — Progress Notes (Signed)
THIS RECORD MAY CONTAIN CONFIDENTIAL INFORMATION THAT SHOULD NOT BE RELEASED WITHOUT REVIEW OF THE SERVICE PROVIDER.  Virtual Follow-Up Visit via Video Note  I connected with Kizzie Bane  on 03/02/23 at 10:30 AM EST by a video enabled telemedicine application and verified that I am speaking with the correct person using two identifiers.   Patient/parent location: dorm, [facility] Provider location: remote [place]    I discussed the limitations of evaluation and management by telemedicine and the availability of in person appointments.  I discussed that the purpose of this telehealth visit is to provide medical care while limiting exposure to the novel coronavirus.  The patient expressed understanding and agreed to proceed.   Deanna Reilly is a 22 y.o. female referred by Kalman Jewels, MD here today for follow-up of ADHD, sleep disturbance, constipation.   History was provided by the patient.  Supervising Physician: Dr. Theadore Nan   Plan from Last Visit:   1. Iron deficiency Hgb improved since September; normalized - POCT hemoglobin   2. ADHD (attention deficit hyperactivity disorder), combined type -advised to try Ritalin 5 mg twice daily with food for longer period of time so we can assess symptom management; discussed this is a low dose and may need to be titrated up; can increase to 10 mg in AM and 5 or 10 mg in PM; advised to report new or worsening symptoms; return precautions reviewed; 2-3 weeks video follow up or sooner if needed    3. Constipation, unspecified constipation type -recommended continual daily dose; increase water intake   Chief Complaint: Acutely sick   History of Present Illness:  -getting over cold/sickness; has been taking Dayquil (3 days) and Nyquil (twice) -Ritalin 5 mg feels like it makes things less as chaotic but not actually helping with focus -a little more constipation; recognizing she does need the daily dose   -hydroxyzine felt like it carried over for the next 2 days; this was before she started meds for current sickness   No Known Allergies Outpatient Medications Prior to Visit  Medication Sig Dispense Refill   albuterol (VENTOLIN HFA) 108 (90 Base) MCG/ACT inhaler Inhale 2 puffs into the lungs every 6 (six) hours as needed for wheezing or shortness of breath. 8 g 2   etonogestrel (NEXPLANON) 68 MG IMPL implant 1 each (68 mg total) by Subdermal route once. 1 each 0   hydrOXYzine (ATARAX) 25 MG tablet Take 1-2 tablets (25-50 mg total) by mouth at bedtime as needed. 60 tablet 2   methylphenidate (RITALIN) 5 MG tablet Take 1 tablet (5 mg total) by mouth 2 (two) times daily. 60 tablet 0   norethindrone (AYGESTIN) 5 MG tablet TAKE 1 TABLET (5 MG TOTAL) BY MOUTH DAILY. 90 tablet 3   polyethylene glycol powder (GLYCOLAX/MIRALAX) 17 GM/SCOOP powder Take 1-2 capfuls daily for 1 soft bowel movement daily 578 g 6   No facility-administered medications prior to visit.     Patient Active Problem List   Diagnosis Date Noted   Constipation 04/12/2020   Chronic midline thoracic back pain 11/06/2019   Absolute anemia 12/13/2017   ADHD (attention deficit hyperactivity disorder), combined type 09/20/2012   Generalized anxiety disorder 09/20/2012   Sleep disturbance 09/20/2012    The following portions of the patient's history were reviewed and updated as appropriate: allergies, current medications, past family history, past medical history, past social history, past surgical history, and problem list.  Visual Observations/Objective:   General Appearance: Well nourished well developed, in no  apparent distress.  Eyes: conjunctiva no swelling or erythema ENT/Mouth: hyponasal speech with congestion noted. No wheezing or WOB.  Neck: Supple  Respiratory: Respiratory effort normal, normal rate, no retractions or distress.   Cardio: Appears well-perfused, noncyanotic Musculoskeletal: no obvious  deformity Skin: visible skin without rashes, ecchymosis, erythema Neuro: Awake and oriented X 3,  Psych:  normal affect, Insight and Judgment appropriate.    Assessment/Plan:   1. ADHD (attention deficit hyperactivity disorder), combined type 2. Constipation, unspecified constipation type 3. Sleep disturbance  -try hydroxyzine earlier or 1/2 tablet dose (12.5 mg); increase water intake  -encouraged daily Miralax dosing 1-2 capfuls in 8-10 oz water -recommended increase to 10 mg Ritalin when she is feeling better from current sickness -return precautions reviewed  -follow-up by My Chart with update within next week or so   I discussed the assessment and treatment plan with the patient and/or parent/guardian.  They were provided an opportunity to ask questions and all were answered.  They agreed with the plan and demonstrated an understanding of the instructions. They were advised to call back or seek an in-person evaluation in the emergency room if the symptoms worsen or if the condition fails to improve as anticipated.   Follow-up:   pending my chart follow-up    Georges Mouse, NP    CC: Kalman Jewels, MD, Kalman Jewels, MD

## 2023-03-16 DIAGNOSIS — F913 Oppositional defiant disorder: Secondary | ICD-10-CM | POA: Diagnosis not present

## 2023-03-17 ENCOUNTER — Encounter: Payer: Self-pay | Admitting: Family

## 2023-03-21 ENCOUNTER — Other Ambulatory Visit (HOSPITAL_COMMUNITY)
Admission: RE | Admit: 2023-03-21 | Discharge: 2023-03-21 | Disposition: A | Discharge status: Home / Self Care | Payer: Medicaid Other | Source: Ambulatory Visit | Attending: Family | Admitting: Family

## 2023-03-21 ENCOUNTER — Encounter: Payer: Self-pay | Admitting: Family

## 2023-03-21 ENCOUNTER — Ambulatory Visit: Payer: Medicaid Other | Admitting: Family

## 2023-03-21 VITALS — BP 127/82 | HR 72 | Ht 61.81 in | Wt 160.4 lb

## 2023-03-21 DIAGNOSIS — Z113 Encounter for screening for infections with a predominantly sexual mode of transmission: Secondary | ICD-10-CM

## 2023-03-21 DIAGNOSIS — Z3046 Encounter for surveillance of implantable subdermal contraceptive: Secondary | ICD-10-CM

## 2023-03-21 DIAGNOSIS — Z3202 Encounter for pregnancy test, result negative: Secondary | ICD-10-CM

## 2023-03-21 MED ORDER — ETONOGESTREL 68 MG ~~LOC~~ IMPL
68.0000 mg | DRUG_IMPLANT | Freq: Once | SUBCUTANEOUS | Status: AC
  Administered 2023-03-21: 68 mg via SUBCUTANEOUS

## 2023-03-21 NOTE — Progress Notes (Addendum)
History was provided by the patient and grandmother.  Deanna Reilly is a 22 y.o. female who is here for Nexplanon removal and replacement.   PCP confirmed? Yes.    Kalman Jewels, MD  HPI:   -desires implant removal and replacement  -nexplanon insertion was 06/04/2019  Patient Active Problem List   Diagnosis Date Noted   Constipation 04/12/2020   Chronic midline thoracic back pain 11/06/2019   Absolute anemia 12/13/2017   ADHD (attention deficit hyperactivity disorder), combined type 09/20/2012   Generalized anxiety disorder 09/20/2012   Sleep disturbance 09/20/2012    Current Outpatient Medications on File Prior to Visit  Medication Sig Dispense Refill   albuterol (VENTOLIN HFA) 108 (90 Base) MCG/ACT inhaler Inhale 2 puffs into the lungs every 6 (six) hours as needed for wheezing or shortness of breath. 8 g 2   hydrOXYzine (ATARAX) 25 MG tablet Take 1-2 tablets (25-50 mg total) by mouth at bedtime as needed. 60 tablet 2   methylphenidate (RITALIN) 5 MG tablet Take 1 tablet (5 mg total) by mouth 2 (two) times daily. 60 tablet 0   polyethylene glycol powder (GLYCOLAX/MIRALAX) 17 GM/SCOOP powder Take 1-2 capfuls daily for 1 soft bowel movement daily 578 g 6   No current facility-administered medications on file prior to visit.    No Known Allergies  Physical Exam:    Vitals:   03/21/23 1503  BP: 127/82  Pulse: 72  Weight: 160 lb 6.4 oz (72.8 kg)  Height: 5' 1.81" (1.57 m)    Growth %ile SmartLinks can only be used for patients less than 79 years old. No LMP recorded.  Physical Exam Vitals and nursing note reviewed.  Constitutional:      General: She is not in acute distress.    Appearance: She is well-developed.  Neck:     Thyroid: No thyromegaly.  Cardiovascular:     Rate and Rhythm: Normal rate and regular rhythm.     Heart sounds: No murmur heard. Pulmonary:     Breath sounds: Normal breath sounds.  Musculoskeletal:     Right lower leg: No edema.      Left lower leg: No edema.  Lymphadenopathy:     Cervical: No cervical adenopathy.  Skin:    General: Skin is warm.     Capillary Refill: Capillary refill takes less than 2 seconds.     Findings: No rash.     Comments: Implant palpable in correct position after new implant inserted   Neurological:     General: No focal deficit present.     Mental Status: She is alert and oriented to person, place, and time.     Comments: No tremor      Assessment/Plan: 1. Encounter for removal and reinsertion of Nexplanon -see procedure note below.  - Subdermal Etonogestrel Implant Insertion - etonogestrel (NEXPLANON) implant 68 mg  2. Routine screening for STI (sexually transmitted infection) - Urine cytology ancillary only  3. Pregnancy examination or test, negative result - POCT urine pregnancy Risks & benefits of Nexplanon removal discussed. Consent form signed.  The patient denies any allergies to anesthetics or antiseptics.  Procedure: Pt was placed in supine position. left arm was flexed at the elbow and externally rotated so that her wrist was parallel to her ear, The device was palpated and marked. The site was cleaned with Betadine. The area surrounding the device was covered with a sterile drape. 1% lidocaine was injected just under the device. A scalpel was used to create a  small incision. The device was pushed towards the incision. Fibrous tissue surrounding the device was gradually removed from the device. The device was removed and measured to ensure all 4 cm of device was removed.   Nexplanon Insertion  No contraindications for placement.  No liver disease, no unexplained vaginal bleeding, no h/o breast cancer, no h/o blood clots.  No LMP recorded.  UHCG: negative  Last Unprotected sex:  NA   Risks & benefits of Nexplanon discussed The nexplanon device was purchased and supplied by [facility]. Packaging instructions supplied to patient Consent form signed  The  patient denies any allergies to anesthetics or antiseptics.  Procedure: Pt was placed in supine position. The left arm was flexed at the elbow and externally rotated so that left wrist was parallel to left ear The medial epicondyle of the left arm was identified The insertions site was marked 8 cm proximal to the medial epicondyle The insertion site was cleaned with Betadine The area surrounding the insertion site was covered with a sterile drape 1% lidocaine was injected just under the skin at the insertion site extending 4 cm proximally. The sterile preloaded disposable Nexaplanon applicator was removed from the sterile packaging The applicator needle was inserted at a 30 degree angle at 8 cm proximal to the medial epicondyle as marked The applicator was lowered to a horizontal position and advanced just under the skin for the full length of the needle The slider on the applicator was retracted fully while the applicator remained in the same position, then the applicator was removed. The implant was confirmed via palpation as being in position The implant position was demonstrated to the patient Pressure dressing was applied to the patient.  The patient was instructed to removed the pressure dressing in 24 hrs.  The patient was advised to move slowly from a supine to an upright position  The patient denied any concerns or complaints  The patient was instructed to schedule a follow-up appt in 1 month and to call sooner if any concerns.  The patient acknowledged agreement and understanding of the plan.

## 2023-03-24 LAB — URINE CYTOLOGY ANCILLARY ONLY
Bacterial Vaginitis-Urine: NEGATIVE
Candida Urine: NEGATIVE
Chlamydia: NEGATIVE
Comment: NEGATIVE
Comment: NEGATIVE
Comment: NORMAL
Neisseria Gonorrhea: NEGATIVE
Trichomonas: NEGATIVE

## 2023-03-30 ENCOUNTER — Encounter: Payer: Self-pay | Admitting: Family

## 2023-04-12 ENCOUNTER — Other Ambulatory Visit: Payer: Self-pay | Admitting: Family

## 2023-04-14 ENCOUNTER — Other Ambulatory Visit: Payer: Self-pay | Admitting: Family

## 2023-04-14 MED ORDER — METHYLPHENIDATE HCL 5 MG PO TABS: 5.0000 mg | ORAL_TABLET | Freq: Two times a day (BID) | ORAL | 0 refills | Status: DC

## 2023-04-14 MED ORDER — HYDROXYZINE HCL 25 MG PO TABS: 25.0000 mg | ORAL_TABLET | Freq: Every evening | ORAL | 1 refills | Status: DC | PRN

## 2023-04-14 MED ORDER — POLYETHYLENE GLYCOL 3350 17 GM/SCOOP PO POWD: ORAL | 6 refills | Status: DC

## 2023-04-15 ENCOUNTER — Encounter: Payer: Self-pay | Admitting: Family

## 2023-04-15 ENCOUNTER — Telehealth: Payer: Medicaid Other | Admitting: Family

## 2023-04-15 DIAGNOSIS — K5900 Constipation, unspecified: Secondary | ICD-10-CM | POA: Diagnosis not present

## 2023-04-15 DIAGNOSIS — K2100 Gastro-esophageal reflux disease with esophagitis, without bleeding: Secondary | ICD-10-CM | POA: Diagnosis not present

## 2023-04-15 NOTE — Progress Notes (Signed)
 THIS RECORD MAY CONTAIN CONFIDENTIAL INFORMATION THAT SHOULD NOT BE RELEASED WITHOUT REVIEW OF THE SERVICE PROVIDER.  Virtual Follow-Up Visit via Video Note  I connected with Deanna Reilly  on 04/15/23 at 11:00 AM EST by a video enabled telemedicine application and verified that I am speaking with the correct person using two identifiers.   Patient/parent location: [employer] location: [employer]   I discussed the limitations of evaluation and management by telemedicine and the availability of in person appointments.  I discussed that the purpose of this telehealth visit is to provide medical care while limiting exposure to the novel coronavirus.  The patient expressed understanding and agreed to proceed.   Deanna Reilly is a 23 y.o. female referred by Herminio Kirsch, MD here today for follow-up of stomach pains.   History was provided by the patient.  Supervising Physician: Dr. Kreg Helena   Plan from Last Visit:   03/21/23 removal and reinsertion of Nexplanon   Chief Complaint: Stomach pains   History of Present Illness:  -seafood boil with aunt yesterday before dropping her back to dorm -had to use bathroom right when she got back to dorm; had first constipation and then diarrhea  -feels like more could have come out  -when she presses on L lower stomach it hurts, hurts some with moving  -was afraid it was diverticulosis or a hernia after googling  -before seafood boil, she had a piece of steak, collard greens, and corn bread  -she woke up with acid reflux this morning because she had garlic butter with seafood boil  -had stopped taking Miralax  daily a while ago because the taste was making her gag  -the last BM before yesterday was a couple days ago assisted with prune juice  -no fever, no n/v; has been drinking water today but has not eaten anything since last night   No Known Allergies Outpatient Medications Prior to Visit  Medication Sig  Dispense Refill   albuterol  (VENTOLIN  HFA) 108 (90 Base) MCG/ACT inhaler Inhale 2 puffs into the lungs every 6 (six) hours as needed for wheezing or shortness of breath. 8 g 2   hydrOXYzine  (ATARAX ) 25 MG tablet Take 1-2 tablets (25-50 mg total) by mouth at bedtime as needed. 180 tablet 1   methylphenidate  (RITALIN ) 5 MG tablet Take 1 tablet (5 mg total) by mouth 2 (two) times daily. 60 tablet 0   polyethylene glycol powder (GLYCOLAX /MIRALAX ) 17 GM/SCOOP powder Take 1-2 capfuls daily for 1 soft bowel movement daily 578 g 6   No facility-administered medications prior to visit.     Patient Active Problem List   Diagnosis Date Noted   Constipation 04/12/2020   Chronic midline thoracic back pain 11/06/2019   Absolute anemia 12/13/2017   ADHD (attention deficit hyperactivity disorder), combined type 09/20/2012   Generalized anxiety disorder 09/20/2012   Sleep disturbance 09/20/2012     The following portions of the patient's history were reviewed and updated as appropriate: allergies, current medications, past family history, past medical history, past social history, past surgical history, and problem list.  Visual Observations/Objective:   General Appearance: Well nourished well developed, in no apparent distress.  Eyes: conjunctiva no swelling or erythema ENT/Mouth: throat clearing cough at start of video Neck: Supple  Respiratory: Respiratory effort normal, normal rate, no retractions or distress.   Cardio: Appears well-perfused, noncyanotic Musculoskeletal: no obvious deformity Skin: visible skin without rashes, ecchymosis, erythema Neuro: Awake and oriented X 3,  Psych:  normal affect, Insight  and Judgment appropriate.    Assessment/Plan:  1. Constipation, unspecified constipation type (Primary) 2. Gastroesophageal reflux disease with esophagitis without hemorrhage  -reassurance given that she is likely experiencing upset stomach due to chronic constipation and GERD  exacerbation with change in eating patterns over break. Advised to start Miralax  today 2 capfuls in 8 oz water/juice. Reviewed strict ER precautions although low suspicion for acute abdomen based on clinical course and PMH. She will reach out via My Chart later this afternoon/evening for update after Miralax .      I discussed the assessment and treatment plan with the patient and/or parent/guardian.  They were provided an opportunity to ask questions and all were answered.  They agreed with the plan and demonstrated an understanding of the instructions. They were advised to call back or seek an in-person evaluation in the emergency room if the symptoms worsen or if the condition fails to improve as anticipated.   Follow-up:   PRN  Bari CHRISTELLA Molt, NP    CC: Herminio Kirsch, MD, Herminio Kirsch, MD

## 2023-04-16 ENCOUNTER — Other Ambulatory Visit: Payer: Self-pay

## 2023-04-16 ENCOUNTER — Emergency Department (HOSPITAL_COMMUNITY): Payer: Medicaid Other

## 2023-04-16 ENCOUNTER — Emergency Department (HOSPITAL_COMMUNITY)
Admission: EM | Admit: 2023-04-16 | Discharge: 2023-04-16 | Disposition: A | Discharge status: Home / Self Care | Payer: Medicaid Other | Attending: Emergency Medicine | Admitting: Emergency Medicine

## 2023-04-16 ENCOUNTER — Encounter (HOSPITAL_COMMUNITY): Payer: Self-pay

## 2023-04-16 DIAGNOSIS — K5900 Constipation, unspecified: Secondary | ICD-10-CM | POA: Diagnosis not present

## 2023-04-16 DIAGNOSIS — K219 Gastro-esophageal reflux disease without esophagitis: Secondary | ICD-10-CM | POA: Insufficient documentation

## 2023-04-16 DIAGNOSIS — R110 Nausea: Secondary | ICD-10-CM | POA: Insufficient documentation

## 2023-04-16 DIAGNOSIS — Q438 Other specified congenital malformations of intestine: Secondary | ICD-10-CM | POA: Diagnosis not present

## 2023-04-16 DIAGNOSIS — R1031 Right lower quadrant pain: Secondary | ICD-10-CM | POA: Diagnosis not present

## 2023-04-16 LAB — CBC
HCT: 40.6 % (ref 36.0–46.0)
Hemoglobin: 13.2 g/dL (ref 12.0–15.0)
MCH: 28 pg (ref 26.0–34.0)
MCHC: 32.5 g/dL (ref 30.0–36.0)
MCV: 86 fL (ref 80.0–100.0)
Platelets: 250 10*3/uL (ref 150–400)
RBC: 4.72 MIL/uL (ref 3.87–5.11)
RDW: 13.1 % (ref 11.5–15.5)
WBC: 7.8 10*3/uL (ref 4.0–10.5)
nRBC: 0 % (ref 0.0–0.2)

## 2023-04-16 LAB — COMPREHENSIVE METABOLIC PANEL
ALT: 25 U/L (ref 0–44)
AST: 20 U/L (ref 15–41)
Albumin: 4.3 g/dL (ref 3.5–5.0)
Alkaline Phosphatase: 87 U/L (ref 38–126)
Anion gap: 8 (ref 5–15)
BUN: 12 mg/dL (ref 6–20)
CO2: 23 mmol/L (ref 22–32)
Calcium: 9.7 mg/dL (ref 8.9–10.3)
Chloride: 106 mmol/L (ref 98–111)
Creatinine, Ser: 0.74 mg/dL (ref 0.44–1.00)
GFR, Estimated: >60 mL/min (ref >60)
Glucose, Bld: 94 mg/dL (ref 70–99)
Potassium: 3.7 mmol/L (ref 3.5–5.1)
Sodium: 137 mmol/L (ref 135–145)
Total Bilirubin: 0.4 mg/dL (ref 0.0–1.2)
Total Protein: 8.2 g/dL — ABNORMAL HIGH (ref 6.5–8.1)

## 2023-04-16 LAB — HCG, SERUM, QUALITATIVE: Preg, Serum: NEGATIVE

## 2023-04-16 LAB — LIPASE, BLOOD: Lipase: 39 U/L (ref 11–51)

## 2023-04-16 MED ORDER — ONDANSETRON HCL 4 MG/2ML IJ SOLN
4.0000 mg | Freq: Once | INTRAMUSCULAR | Status: AC
  Administered 2023-04-16: 4 mg via INTRAVENOUS
  Filled 2023-04-16: qty 2

## 2023-04-16 MED ORDER — SODIUM CHLORIDE 0.9 % IV BOLUS
1000.0000 mL | Freq: Once | INTRAVENOUS | Status: AC
  Administered 2023-04-16: 1000 mL via INTRAVENOUS

## 2023-04-16 MED ORDER — FLEET ENEMA RE ENEM
1.0000 | ENEMA | Freq: Once | RECTAL | Status: AC
  Administered 2023-04-16: 1 via RECTAL
  Filled 2023-04-16: qty 1

## 2023-04-16 MED ORDER — MORPHINE SULFATE (PF) 4 MG/ML IV SOLN
4.0000 mg | Freq: Once | INTRAVENOUS | Status: AC
  Administered 2023-04-16: 4 mg via INTRAVENOUS
  Filled 2023-04-16: qty 1

## 2023-04-16 MED ORDER — IOHEXOL 300 MG/ML  SOLN
100.0000 mL | Freq: Once | INTRAMUSCULAR | Status: AC | PRN
  Administered 2023-04-16: 100 mL via INTRAVENOUS

## 2023-04-16 NOTE — ED Provider Notes (Signed)
 [place] [facility] AT [facility] Provider Note   CSN: [id number] Arrival date & time: 04/16/23  1007     History  Chief Complaint  Patient presents with   Abdominal Pain    Deanna Reilly is a 23 y.o. female with cervical HD, GERD, constipation presented with lower abdominal pain for the past 2 days.  Patient that she has not had a bowel movement the past 2 days.  Patient states that the pain does make her nauseous and feels like a pulsating pain.  Patient states she is able to urinate without issue and eat without issue however is not eating today.  Patient does note that she has some discomfort in the right lower quadrant as well and still has her appendix.  Patient has not taken anything for the pain.  Patient denies chest pain or shortness of breath or emesis.  Patient denies dysuria, hematuria, vaginal discharge or bleeding.  Home Medications Prior to Admission medications   Medication Sig Start Date End Date Taking? Authorizing Provider  albuterol  (VENTOLIN  HFA) 108 (90 Base) MCG/ACT inhaler Inhale 2 puffs into the lungs every 6 (six) hours as needed for wheezing or shortness of breath. 12/15/22   Carmell Remak, MD  hydrOXYzine  (ATARAX ) 25 MG tablet Take 1-2 tablets (25-50 mg total) by mouth at bedtime as needed. 04/14/23   Joshua Bari HERO, NP  methylphenidate  (RITALIN ) 5 MG tablet Take 1 tablet (5 mg total) by mouth 2 (two) times daily. 04/14/23   Joshua Bari HERO, NP  polyethylene glycol powder (GLYCOLAX /MIRALAX ) 17 GM/SCOOP powder Take 1-2 capfuls daily for 1 soft bowel movement daily 04/14/23   Joshua Bari HERO, NP      Allergies    Patient has no known allergies.    Review of Systems   Review of Systems  Gastrointestinal:  Positive for abdominal pain.    Physical Exam Updated Vital Signs BP 120/83   Pulse 99   Temp 98.2 F (36.8 C) (Oral)   Resp 16   SpO2 99%  Physical Exam Vitals reviewed.  Constitutional:      General: She is not in  acute distress. HENT:     Head: Normocephalic and atraumatic.  Eyes:     Extraocular Movements: Extraocular movements intact.     Conjunctiva/sclera: Conjunctivae normal.     Pupils: Pupils are equal, round, and reactive to light.  Cardiovascular:     Rate and Rhythm: Normal rate and regular rhythm.     Pulses: Normal pulses.     Heart sounds: Normal heart sounds.     Comments: 2+ bilateral radial/dorsalis pedis pulses with regular rate Pulmonary:     Effort: Pulmonary effort is normal. No respiratory distress.     Breath sounds: Normal breath sounds.  Abdominal:     Palpations: Abdomen is soft.     Tenderness: There is abdominal tenderness in the right lower quadrant. There is rebound (Right lower quadrant). There is no guarding. Positive signs include Rovsing's sign.  Musculoskeletal:        General: Normal range of motion.     Cervical back: Normal range of motion and neck supple.     Comments: 5 out of 5 bilateral grip/leg extension strength  Skin:    General: Skin is warm and dry.     Capillary Refill: Capillary refill takes less than 2 seconds.  Neurological:     General: No focal deficit present.     Mental Status: She is alert and oriented  to person, place, and time.     Comments: Sensation intact in all 4 limbs  Psychiatric:        Mood and Affect: Mood normal.     ED Results / Procedures / Treatments   Labs (all labs ordered are listed, but only abnormal results are displayed) Labs Reviewed  COMPREHENSIVE METABOLIC PANEL - Abnormal; Notable for the following components:      Result Value   Total Protein 8.2 (*)    All other components within normal limits  LIPASE, BLOOD  CBC  HCG, SERUM, QUALITATIVE  URINALYSIS, ROUTINE W REFLEX MICROSCOPIC    EKG None  Radiology CT ABDOMEN PELVIS W CONTRAST Result Date: 04/16/2023 CLINICAL DATA:  Right lower quadrant pain EXAM: CT ABDOMEN AND PELVIS WITH CONTRAST TECHNIQUE: Multidetector CT imaging of the abdomen and  pelvis was performed using the standard protocol following bolus administration of intravenous contrast. RADIATION DOSE REDUCTION: This exam was performed according to the departmental dose-optimization program which includes automated exposure control, adjustment of the mA and/or kV according to patient size and/or use of iterative reconstruction technique. CONTRAST:  OMNIPAQUE  IOHEXOL  300 MG/ML  SOLN COMPARISON:  None Available. FINDINGS: Lower chest: There is some linear opacity lung bases likely scar or atelectasis. No pleural effusion. Hepatobiliary: Mild focal fat deposition seen in the liver adjacent to the falciform ligament. Gallbladder is present. Patent portal vein. Pancreas: Unremarkable. No pancreatic ductal dilatation or surrounding inflammatory changes. Spleen: Normal in size without focal abnormality. Adrenals/Urinary Tract: Adrenal glands are unremarkable. Kidneys are normal, without renal calculi, focal lesion, or hydronephrosis. Bladder is unremarkable. Stomach/Bowel: Moderate colonic stool. Slightly redundant course of the transverse colon. The stomach and small bowel are nondilated. Normal retrocecal appendix in the right lower quadrant. Vascular/Lymphatic: Normal caliber aorta. 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 No specific abnormal lymph node enlargement seen in the abdomen and pelvis. Reproductive: Uterus is retroflexed.  No separate adnexal mass. Other: Trace free fluid in the pelvis which could be physiologic. Musculoskeletal: Slight posterior disc bulging at L5-S1. IMPRESSION: Moderate colonic stool.  No obstruction, free air.  Normal appendix. Trace free fluid in the pelvis could be physiologic. Electronically Signed   By: Ranell Bring M.D.   On: 04/16/2023 16:55    Procedures Procedures    Medications Ordered in ED Medications  morphine  (PF) 4 MG/ML injection 4 mg (4 mg Intravenous Given 04/16/23 1537)   sodium chloride  0.9 % bolus 1,000 mL (0 mLs Intravenous Stopped 04/16/23 1731)  iohexol  (OMNIPAQUE ) 300 MG/ML solution 100 mL (100 mLs Intravenous Contrast Given 04/16/23 1613)  ondansetron  (ZOFRAN ) injection 4 mg (4 mg Intravenous Given 04/16/23 1727)  sodium phosphate (FLEET) enema 1 enema (1 enema Rectal Given 04/16/23 1731)    ED Course/ Medical Decision Making/ A&P                                 Medical Decision Making Amount and/or Complexity of Data Reviewed Labs: ordered. Radiology: ordered.  Risk OTC drugs. Prescription drug management.   Shelie O Buehrle 22 y.o. presented today for abdominal pain.  Working DDx that I considered at this time includes, but not limited to, constipation, gastroenteritis, colitis, small bowel obstruction, appendicitis, cholecystitis, hepatobiliary pathology, gastritis, PUD, ACS, aortic dissection, diverticulosis/diverticulitis, pancreatitis, nephrolithiasis, medication induced, AAA, UTI, pyelonephritis, ruptured ectopic pregnancy, PID, ovarian torsion.  R/o DDx: gastroenteritis, colitis,  small bowel obstruction, appendicitis, cholecystitis, hepatobiliary pathology, gastritis, PUD, ACS, aortic dissection, diverticulosis/diverticulitis, pancreatitis, nephrolithiasis, medication induced, AAA, UTI, pyelonephritis, ruptured ectopic pregnancy, PID, ovarian torsion: These are considered less likely due to history of present illness, physical exam, labs/imaging findings.  Review of prior external notes: 03/21/2023 outpatient visit  Unique Tests and My Interpretation:  CBC with differential: Unremarkable CMP: Unremarkable Lipase: Unremarkable Serum pregnancy: Negative EKG: Rate, rhythm, axis, intervals all examined and without medically relevant abnormality. ST segments without concerns for elevations CT Abd/Pelvis with contrast: Moderate stool burden  Social Determinants of Health: none  Discussion with Independent Historian:  Mother  Discussion of  Management of Tests: None  Risk: Medium: prescription drug management  Risk Stratification Score: None  Plan: On exam patient was no acute distress stable vitals.  On exam patient did have tenderness to the right lower quadrant with some rebound tenderness along with pain in the right lower quadrant are pressed on the left lower quadrant.  Patient states she still has her appendix and so given that the symptoms feel different than her previous constipation episodes we will get CT scan to further evaluate.  Patient given fluids and pain meds.  CT negative does show moderate stool burden with no fecal impaction.  I spoke to the patient and reassured decision-making due to proceed with an enema anticipate discharge with GI follow-up as patient does have chronic constipation issues.  I did discuss with the patient MiraLAX  use along with fluids to help with her constipation patient verbalized understanding acceptance of this.  Patient is ambulating and simply at this states she does not have any urinary symptoms and does not feel as cause for pain and would like to go further urine sample which is reasonable at this time.  After the enema patient did have a bowel movement and feels much better and would like to be discharged.  Patient was given return precautions. Patient stable for discharge at this time.  Patient verbalized understanding of plan.  This chart was dictated using voice recognition software.  Despite best efforts to proofread,  errors can occur which can change the documentation meaning.         Final Clinical Impression(s) / ED Diagnoses Final diagnoses:  Constipation, unspecified constipation type    Rx / DC Orders ED Discharge Orders     None         Victor Lynwood ONEIDA DEVONNA 04/16/23 CLEOTIS Garrick Charleston, MD 04/16/23 2214

## 2023-04-16 NOTE — ED Notes (Signed)
Pt requesting nausea meds.

## 2023-04-16 NOTE — Discharge Instructions (Signed)
 Please follow-up with your GI specialist and your primary care provider regards recent ER visit.  Today your labs are generally reassuring and improved with the enema.  Please use your MiraLAX  as prescribed drink plenty of fluids.  If symptoms change or worsen please return to the ER.

## 2023-04-16 NOTE — ED Triage Notes (Signed)
 Pt arrives via POV. Pt reports left lower quadrant pain. Thought she may be constipated. Denies n/v/d. Last bm 2 days ago. Pt AxOx4.

## 2023-04-16 NOTE — ED Provider Triage Note (Signed)
 Emergency Medicine Provider Triage Evaluation Note  ALICHA RASPBERRY , a 23 y.o. female  was evaluated in triage.  Pt complains of abdominal pain.  Review of Systems  Positive: LLQ, constipation Negative: Vomiting, fever, UTI symp, vag dis  Physical Exam  BP 134/86 (BP Location: Left Arm)   Pulse 83   Temp 98.9 F (37.2 C) (Oral)   Resp 16   SpO2 100%  Gen:   Awake, no distress   Resp:  Normal effort  MSK:   Moves extremities without difficulty  Other:  Abd soft, minimally tender  Medical Decision Making  Medically screening exam initiated at 10:30 AM.  Appropriate orders placed.  Etha O Morfin was informed that the remainder of the evaluation will be completed by another provider, this initial triage assessment does not replace that evaluation, and the importance of remaining in the ED until their evaluation is complete.  Patient with LLQ abdominal pain x 2 days. No fever, vomiting. Last BM was 2 days ago despite daily Miralax  use. H/O constipation without abdominal pain.    Odell Balls, PA-C 04/16/23 1032

## 2023-04-16 NOTE — ED Notes (Signed)
 Pt asked to obtain a urine sample, pt states she cannot walk due to pain

## 2023-04-21 DIAGNOSIS — F913 Oppositional defiant disorder: Secondary | ICD-10-CM | POA: Diagnosis not present

## 2023-04-27 ENCOUNTER — Encounter: Payer: Self-pay | Admitting: Family

## 2023-04-27 ENCOUNTER — Telehealth: Payer: Medicaid Other | Admitting: Family

## 2023-04-27 DIAGNOSIS — K5900 Constipation, unspecified: Secondary | ICD-10-CM | POA: Diagnosis not present

## 2023-04-27 NOTE — Progress Notes (Signed)
 THIS RECORD MAY CONTAIN CONFIDENTIAL INFORMATION THAT SHOULD NOT BE RELEASED WITHOUT REVIEW OF THE SERVICE PROVIDER.  Virtual Follow-Up Visit via Video Note  I connected with Deanna Reilly  on 04/27/23 at  4:00 PM EST by a video enabled telemedicine application and verified that I am speaking with the correct person using two identifiers.   Patient/parent location: dorm, [employer] Provider location: remote, [place]    I discussed the limitations of evaluation and management by telemedicine and the availability of in person appointments.  I discussed that the purpose of this telehealth visit is to provide medical care while limiting exposure to the novel coronavirus.  The patient expressed understanding and agreed to proceed.   Deanna Reilly is a 23 y.o. female referred by Teresia Fennel, MD here today for follow-up of constipation.   History was provided by the patient.  Supervising Physician: Dr. Lavonda Pour   Plan from Last Visit:   1. Constipation, unspecified constipation type (Primary) 2. Gastroesophageal reflux disease with esophagitis without hemorrhage   -reassurance given that she is likely experiencing upset stomach due to chronic constipation and GERD exacerbation with change in eating patterns over break. Advised to start Miralax  today 2 capfuls in 8 oz water/juice. Reviewed strict ER precautions although low suspicion for acute abdomen based on clinical course and PMH. She will reach out via My Chart later this afternoon/evening for update after Miralax .     Chief Complaint: Constipation is improving  History of Present Illness:  -constipation is getting better; the pain she was experiencing is getting better and she is able to correlate it to her bowel movements  -she is using Miralax  daily and also using 1/2 c prune juice to assist with BMs  -questions her anemia; sometimes will feel light-headed in yoga class   Lab Results  Component Value Date   HGB  13.2 04/16/2023   -we reviewed labs from hospital visit 01/04, hgb in normal range   -no issues with menstrual bleeding   No Known Allergies Outpatient Medications Prior to Visit  Medication Sig Dispense Refill   albuterol  (VENTOLIN  HFA) 108 (90 Base) MCG/ACT inhaler Inhale 2 puffs into the lungs every 6 (six) hours as needed for wheezing or shortness of breath. 8 g 2   hydrOXYzine  (ATARAX ) 25 MG tablet Take 1-2 tablets (25-50 mg total) by mouth at bedtime as needed. 180 tablet 1   methylphenidate  (RITALIN ) 5 MG tablet Take 1 tablet (5 mg total) by mouth 2 (two) times daily. 60 tablet 0   polyethylene glycol powder (GLYCOLAX /MIRALAX ) 17 GM/SCOOP powder Take 1-2 capfuls daily for 1 soft bowel movement daily 578 g 6   No facility-administered medications prior to visit.     Patient Active Problem List   Diagnosis Date Noted   Constipation 04/12/2020   Chronic midline thoracic back pain 11/06/2019   Absolute anemia 12/13/2017   ADHD (attention deficit hyperactivity disorder), combined type 09/20/2012   Generalized anxiety disorder 09/20/2012   Sleep disturbance 09/20/2012   The following portions of the patient's history were reviewed and updated as appropriate: allergies, current medications, past family history, past medical history, past social history, past surgical history, and problem list.  Visual Observations/Objective:   General Appearance: Well nourished well developed, in no apparent distress.  Eyes: conjunctiva no swelling or erythema ENT/Mouth: No hoarseness, No cough for duration of visit.  Neck: Supple  Respiratory: Respiratory effort normal, normal rate, no retractions or distress.   Cardio: Appears well-perfused, noncyanotic Musculoskeletal: no obvious  deformity Skin: visible skin without rashes, ecchymosis, erythema Neuro: Awake and oriented X 3,  Psych:  normal affect, Insight and Judgment appropriate.    Assessment/Plan: 1. Constipation, unspecified  constipation type (Primary) -continue with Miralax  + prune juice; return precautions reviewed  -advised to increase water intake and assess if light-headedness in yoga class improves  -report new or worsening symptoms  -return in 3 months for follow-up; repeat PHQSADS/ASRS at that time   I discussed the assessment and treatment plan with the patient and/or parent/guardian.  They were provided an opportunity to ask questions and all were answered.  They agreed with the plan and demonstrated an understanding of the instructions. They were advised to call back or seek an in-person evaluation in the emergency room if the symptoms worsen or if the condition fails to improve as anticipated.   Follow-up:   3 months or sooner if needed   Marijean Shouts, NP    CC: Marijean Shouts, NP, Teresia Fennel, MD

## 2023-05-12 ENCOUNTER — Encounter: Payer: Self-pay | Admitting: Family

## 2023-05-17 DIAGNOSIS — F913 Oppositional defiant disorder: Secondary | ICD-10-CM | POA: Diagnosis not present

## 2023-05-27 ENCOUNTER — Encounter: Payer: Self-pay | Admitting: Family

## 2023-05-30 ENCOUNTER — Telehealth: Payer: Medicaid Other | Admitting: Family

## 2023-05-30 DIAGNOSIS — K2100 Gastro-esophageal reflux disease with esophagitis, without bleeding: Secondary | ICD-10-CM

## 2023-05-30 DIAGNOSIS — K5900 Constipation, unspecified: Secondary | ICD-10-CM | POA: Diagnosis not present

## 2023-05-30 NOTE — Progress Notes (Unsigned)
THIS RECORD MAY CONTAIN CONFIDENTIAL INFORMATION THAT SHOULD NOT BE RELEASED WITHOUT REVIEW OF THE SERVICE PROVIDER.  Virtual Follow-Up Visit via Video Note  I connected with DOCIA KLAR 's {family members:[id number]}  on 05/30/23 at  3:00 PM EST by a video enabled telemedicine application and verified that I am speaking with the correct person using two identifiers.   Patient/parent location: *** Provider location: ***   I discussed the limitations of evaluation and management by telemedicine and the availability of in person appointments.  I discussed that the purpose of this telehealth visit is to provide medical care while limiting exposure to the novel coronavirus.  The {family members:20773} expressed understanding and agreed to proceed.   Deanna Reilly is a 23 y.o. female referred by Georges Mouse, NP here today for follow-up of ***.  Previsit planning completed:  {YES/NO/NOT APPLICABLE:20182}   History was provided by the {CHL AMB PERSONS; PED RELATIVES/OTHER W/PATIENT:[id number]}.  Supervising Physician: Dr. Theadore Nan   Plan from Last Visit:   ***  Chief Complaint: ***  History of Present Illness:  Acid reflux worsening: triggers: pizza, spaghetti,  Got bagels, honey wheat bagels, almond milk  No Tums in last few days; with last flare would take 5-6 in 24/hrs  -would start burning when she was laying down  -taking ibuprofen for migraine or back pain with gas pains  -will only take 4 during day or 4 at night sleep  -was dealing with light-headedness; feels off for some reason; sometimes doing the vertigo movement; was told at  -was cutting butter with a very sharp knife on Saturday; called 911 because she was bleeding a lot; EMS said it did not require stitches; went to [facility] clinic today and it checked out today and was fine. Got more supplies to bandage it.   No Known Allergies Outpatient Medications Prior to Visit  Medication Sig Dispense Refill    albuterol (VENTOLIN HFA) 108 (90 Base) MCG/ACT inhaler Inhale 2 puffs into the lungs every 6 (six) hours as needed for wheezing or shortness of breath. 8 g 2   hydrOXYzine (ATARAX) 25 MG tablet Take 1-2 tablets (25-50 mg total) by mouth at bedtime as needed. 180 tablet 1   methylphenidate (RITALIN) 5 MG tablet Take 1 tablet (5 mg total) by mouth 2 (two) times daily. 60 tablet 0   polyethylene glycol powder (GLYCOLAX/MIRALAX) 17 GM/SCOOP powder Take 1-2 capfuls daily for 1 soft bowel movement daily 578 g 6   No facility-administered medications prior to visit.     Patient Active Problem List   Diagnosis Date Noted   Constipation 04/12/2020   Chronic midline thoracic back pain 11/06/2019   Absolute anemia 12/13/2017   ADHD (attention deficit hyperactivity disorder), combined type 09/20/2012   Generalized anxiety disorder 09/20/2012   Sleep disturbance 09/20/2012    Social History: Lives with:  {Persons; PED relatives w/patient:19415} and describes home situation as *** School: In Grade *** at [employer] Future Plans:  {CHL AMB PED FUTURE WUJWJ:1914782956} Exercise:  {Exercise:23478} Sports:  {Misc; sports:10024} Sleep:  {SX; SLEEP PATTERNS:18802}  Confidentiality was discussed with the patient and if applicable, with caregiver as well.  Patient's personal or confidential phone number: *** Enter confidential phone number in Family Comments section of [employer] Tobacco?  {YES/NO/WILD OZHYQ:65784} Drugs/ETOH?  {YES/NO/WILD ONGEX:52841} Partner preference?  {CHL AMB PARTNER [phone]} Sexually Active?  {YES/NO/WILD LKGMW:10272}  Pregnancy Prevention:  {Pregnancy Prevention:[phone]}, reviewed condoms & plan B Trauma currently or in the pastt?  {YES/NO/WILD ZDGUY:40347}  Suicidal or Self-Harm thoughts?   {YES/NO/WILD WJXBJ:47829} Guns in the home?  {YES/NO/WILD FAOZH:08657}  {Common ambulatory SmartLinks:19316}  Visual Observations/Objective:  *** General Appearance:  Well nourished well developed, in no apparent distress.  Eyes: conjunctiva no swelling or erythema ENT/Mouth: No hoarseness, No cough for duration of visit.  Neck: Supple  Respiratory: Respiratory effort normal, normal rate, no retractions or distress.   Cardio: Appears well-perfused, noncyanotic Musculoskeletal: no obvious deformity Skin: visible skin without rashes, ecchymosis, erythema Neuro: Awake and oriented X 3,  Psych:  normal affect, Insight and Judgment appropriate.    Assessment/Plan: There are no diagnoses linked to this encounter.  BH screenings:     02/20/2023    9:12 AM 09/15/2022    2:54 PM 10/20/2021    4:59 PM  PHQ-SADS Last 3 Score only  PHQ-15 Score 5  5  Total GAD-7 Score 6  1  PHQ Adolescent Score  0    *** Screens discussed with patient and parent and adjustments to plan made accordingly.   I discussed the assessment and treatment plan with the patient and/or parent/guardian.  They were provided an opportunity to ask questions and all were answered.  They agreed with the plan and demonstrated an understanding of the instructions. They were advised to call back or seek an in-person evaluation in the emergency room if the symptoms worsen or if the condition fails to improve as anticipated.   Follow-up:   ***  I spent >*** minutes spent face to face with patient with more than 50% of appointment spent discussing diagnosis, management, follow-up, and reviewing of ***. I spent an additional *** minutes on pre-and post-visit activities. I was located *** during this encounter.   Georges Mouse, NP    CC: Georges Mouse, NP, Georges Mouse, NP

## 2023-05-31 ENCOUNTER — Encounter: Payer: Self-pay | Admitting: Family

## 2023-06-02 ENCOUNTER — Encounter: Payer: Self-pay | Admitting: Family

## 2023-06-15 DIAGNOSIS — F913 Oppositional defiant disorder: Secondary | ICD-10-CM | POA: Diagnosis not present

## 2023-07-10 ENCOUNTER — Encounter: Payer: Self-pay | Admitting: Family

## 2023-07-13 ENCOUNTER — Ambulatory Visit (INDEPENDENT_AMBULATORY_CARE_PROVIDER_SITE_OTHER): Payer: Medicaid Other | Admitting: Pediatrics

## 2023-07-13 ENCOUNTER — Other Ambulatory Visit (HOSPITAL_COMMUNITY)
Admission: RE | Admit: 2023-07-13 | Discharge: 2023-07-13 | Disposition: A | Discharge status: Home / Self Care | Source: Ambulatory Visit | Attending: Pediatrics | Admitting: Pediatrics

## 2023-07-13 VITALS — BP 112/76 | Ht 62.01 in | Wt 165.0 lb

## 2023-07-13 DIAGNOSIS — Z1331 Encounter for screening for depression: Secondary | ICD-10-CM | POA: Diagnosis not present

## 2023-07-13 DIAGNOSIS — K2100 Gastro-esophageal reflux disease with esophagitis, without bleeding: Secondary | ICD-10-CM

## 2023-07-13 DIAGNOSIS — Z68.41 Body mass index (BMI) pediatric, 85th percentile to less than 95th percentile for age: Secondary | ICD-10-CM | POA: Diagnosis not present

## 2023-07-13 DIAGNOSIS — Z113 Encounter for screening for infections with a predominantly sexual mode of transmission: Secondary | ICD-10-CM | POA: Insufficient documentation

## 2023-07-13 DIAGNOSIS — K5900 Constipation, unspecified: Secondary | ICD-10-CM

## 2023-07-13 DIAGNOSIS — Z1339 Encounter for screening examination for other mental health and behavioral disorders: Secondary | ICD-10-CM | POA: Diagnosis not present

## 2023-07-13 DIAGNOSIS — R7309 Other abnormal glucose: Secondary | ICD-10-CM | POA: Diagnosis not present

## 2023-07-13 DIAGNOSIS — F902 Attention-deficit hyperactivity disorder, combined type: Secondary | ICD-10-CM | POA: Diagnosis not present

## 2023-07-13 DIAGNOSIS — Z114 Encounter for screening for human immunodeficiency virus [HIV]: Secondary | ICD-10-CM

## 2023-07-13 DIAGNOSIS — Z0001 Encounter for general adult medical examination with abnormal findings: Secondary | ICD-10-CM | POA: Diagnosis not present

## 2023-07-13 DIAGNOSIS — Z23 Encounter for immunization: Secondary | ICD-10-CM

## 2023-07-13 LAB — POCT RAPID HIV: Rapid HIV, POC: NEGATIVE

## 2023-07-13 NOTE — Progress Notes (Signed)
 Adolescent Well Care Visit Deanna Reilly is a 23 y.o. female who is here for well care.    PCP:  Georges Mouse, NP   History was provided by the patient.  Confidentiality was discussed with the patient and, if applicable, with caregiver as well. Patient's personal or confidential phone number: [phone]   Current Issues: Current concerns include Wants to check Hgb A1C because family members have high cholesterol and diabetes. She has had an elevated HgbA1C in the past that has not been rechecked  Patient has been managed in adolescent clinic since 03/2021. She needs to transition to an adult care primary provider.   Adolescent clinic treats ADHD anxiety and birth control GERD and constipation Nexplanon placed 03/21/23  Last GC Chlamydia 03/2023 negative Last HIV 2022 negative TSH T4 2024 normal Hgb A1C 2024 6  Current meds:  Albuterol inhaler prn-lats used > 6 months ago-no refills needed Miralax prn-well controlled constipation  Atarax 25 prn Ritalin 5 BID-tales as needed. Currently rarely using  Nutrition:no-recommended daily vitamin Nutrition/Eating Behaviors: Eats large volumes and sometimes has GERD symptoms-currently not a concern Adequate calcium in diet?: No. 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 Menstrual History: no menses since being on nexplanon and off OCPs   Confidential Social History: Tobacco?  no Secondhand smoke exposure?  no Drugs/ETOH?  no  Sexually Active?  no   Pregnancy Prevention: nexplanon  Safe at home, in school & in relationships?  Yes Safe to self?  Yes   Screenings: Patient has a dental  home: yes  The patient completed the Rapid Assessment for Adolescent Preventive Services screening questionnaire and the following topics were identified as risk factors and discussed: healthy eating, exercise, weapon use, sexuality, suicidality/self harm, and mental health issues    PHQ-9 completed and results indicated low risk score     07/13/2023    2:51 PM 07/13/2023    2:45 PM 09/15/2022    2:54 PM 07/22/2021   12:16 PM 04/21/2021    4:41 PM  Depression screen PHQ 2/9  Decreased Interest 0 0 0 0 1  Down, Depressed, Hopeless 0 0 0 0 0  PHQ - 2 Score 0 0 0 0 1  Altered sleeping    0 1  Tired, decreased energy    0 1  Change in appetite    0 0  Feeling bad or failure about yourself     0 0  Trouble concentrating    0 0  Moving slowly or fidgety/restless    0 0  PHQ-9 Score    0 3     Physical Exam:  Vitals:   07/13/23 1440  BP: 112/76  Weight: 165 lb (74.8 kg)  Height: 5' 2.01" (1.575 m)   BP 112/76   Ht 5' 2.01" (1.575 m)   Wt 165 lb (74.8 kg)   BMI 30.17 kg/m  Body mass index: body mass index is 30.17 kg/m. Growth %ile SmartLinks can only be used for patients less than 58 years old.  Hearing Screening   500Hz  1000Hz  2000Hz  3000Hz  4000Hz   Right ear 20 20 20 20 20   Left ear 20 20 20 20 20    Vision  Screening   Right eye Left eye Both eyes  Without correction 20/20 20/20 20/20   With correction       General Appearance:   alert, oriented, no acute distress and pleasant  HENT: Normocephalic, no obvious abnormality, conjunctiva clear  Mouth:   Normal appearing teeth, no obvious discoloration, dental caries, or dental caps  Neck:   Supple; thyroid: no enlargement, symmetric, no tenderness/mass/nodules  Chest Large breasts Tanner5  Lungs:   Clear to auscultation bilaterally, normal work of breathing  Heart:   Regular rate and rhythm, S1 and S2 normal, no murmurs;   Abdomen:   Soft, non-tender, no mass, or organomegaly  GU normal female external genitalia, pelvic not  performed  Musculoskeletal:   Tone and strength strong and symmetrical, all extremities               Lymphatic:   No cervical adenopathy  Skin/Hair/Nails:   Skin warm, dry and intact, no rashes, no bruises or petechiae  Neurologic:   Strength, gait, and coordination normal and age-appropriate     Assessment and Plan:    1. Encounter for general adult medical examination with abnormal findings (Primary) Annual CPE Problems outlined below Patient transitioning to adult care and will have birth control care with Adolescent clinic   BMI is not appropriate for age  Hearing screening result:normal Vision screening result: normal  2. BMI (body mass index), pediatric, 85% to less than 95% for age Counseled regarding 5-2-1-0 goals of healthy active living including:  - eating at least 5 fruits and vegetables a day - at least 1 hour of activity - no sugary beverages - eating three meals each day with age-appropriate servings - age-appropriate screen time - age-appropriate sleep patterns    ordered Hgb A1C and labs below-fasting Will call with results and intervene as indicated.   - Lipid panel; Future - Comprehensive metabolic panel with GFR; Future - TSH + free T4; Future  3. Elevated hemoglobin A1c in the past. Needs repeat Last checked 2024 and level was 6 - Hemoglobin A1c; Future  4. ADHD (attention deficit hyperactivity disorder), combined type Treated by Adolescent clinic  5. Constipation, unspecified constipation type Currently well controlled  6. Gastroesophageal reflux disease with esophagitis without hemorrhage Monitoring-no meds currently  7. Screening examination for venereal disease  - Urine cytology ancillary only  8. Screening for human immunodeficiency virus  - POCT Rapid HIV  9. Need for vaccination Needs Men B since in college dorm but aged out of our state supply Recommended obtaining in college or at [facility]      Return for First morning  lab appointment when available.. Will follow up labs by phone  Kalman Jewels, MD

## 2023-07-13 NOTE — Patient Instructions (Addendum)
 Below are some options for an adult care provider.  Please consider Meningitis B vaccine-available at [facility]  If unable to get labs in this clinic may go to an outpatient [facility] lab [email or web address]  Adult Primary Care Clinics Name Criteria Services   [facility]  Address: [address] [place], [place] [address]  Phone: [phone] Hours: Monday - Friday 9 AM -6 PM  Types of insurance accepted:  Commercial insurance [place] [employer] (orange card) [employer] Uninsured  Language services:  Video and phone interpreters available   Ages 35 and older    Adult primary care Onsite pharmacy Integrated behavioral health Financial assistance counseling Walk-in hours for established patients  Financial assistance counseling hours: Tuesdays 2:00PM - 5:00PM  Thursday 8:30AM - 4:30PM  Space is limited, 10 on Tuesday and 20 on Thursday. It's on first come first serve basis  Name Criteria Services   [facility]  Address: [address] [place], [place] [address]  Phone: [phone]  Hours: Monday - Friday 8:30 AM - 5 PM  Types of insurance accepted:  Commercial insurance Medicaid Medicare Uninsured  Language services:  Video and phone interpreters available   All ages - newborn to adult   Primary care for all ages (children and adults) Integrated behavioral health Nutritionist Financial assistance counseling   Name Criteria Services   [facility]  Located on the ground floor of [facility]  Address: [address]  [place],  [place]  [address]  Phone: [phone]  Hours: Monday - Friday 8:15 AM - 5 PM  Types of insurance accepted:  Commercial insurance Medicaid Medicare Uninsured  Language services:  Video and phone interpreters available   Ages 28 and older    Adult primary care Nutritionist Certified Diabetes Educator  Integrated behavioral health Financial assistance counseling   Name Criteria Services   [facility]  Address: [address] [place], [place] [address]  Phone: [phone]  Hours: Monday - Friday 8:30 AM - 5 PM    Types of insurance accepted:  Nurse, learning disability Medicaid Medicare Uninsured  Language services:  Video and phone interpreters available   All ages - newborn to adult   Primary care for all ages (children and adults) Integrated behavioral health Financial assistance counseling

## 2023-07-14 DIAGNOSIS — F913 Oppositional defiant disorder: Secondary | ICD-10-CM | POA: Diagnosis not present

## 2023-07-14 LAB — URINE CYTOLOGY ANCILLARY ONLY
Chlamydia: NEGATIVE
Comment: NEGATIVE
Comment: NORMAL
Neisseria Gonorrhea: NEGATIVE

## 2023-07-15 ENCOUNTER — Other Ambulatory Visit

## 2023-07-15 ENCOUNTER — Encounter: Payer: Self-pay | Admitting: Family

## 2023-07-15 ENCOUNTER — Other Ambulatory Visit: Payer: Self-pay | Admitting: Pediatrics

## 2023-07-15 DIAGNOSIS — K5900 Constipation, unspecified: Secondary | ICD-10-CM | POA: Diagnosis not present

## 2023-07-15 DIAGNOSIS — K2100 Gastro-esophageal reflux disease with esophagitis, without bleeding: Secondary | ICD-10-CM | POA: Diagnosis not present

## 2023-07-15 DIAGNOSIS — F902 Attention-deficit hyperactivity disorder, combined type: Secondary | ICD-10-CM | POA: Diagnosis not present

## 2023-07-15 DIAGNOSIS — R7309 Other abnormal glucose: Secondary | ICD-10-CM | POA: Diagnosis not present

## 2023-07-16 LAB — LIPID PANEL
Cholesterol: 138 mg/dL (ref <200)
HDL: 50 mg/dL (ref >=50)
LDL Cholesterol (Calc): 74 mg/dL
Non-HDL Cholesterol (Calc): 88 mg/dL (ref <130)
Total CHOL/HDL Ratio: 2.8 (calc) (ref <5.0)
Triglycerides: 61 mg/dL (ref <150)

## 2023-07-16 LAB — HEMOGLOBIN A1C
Hgb A1c MFr Bld: 5.9 %{Hb} — ABNORMAL HIGH (ref <5.7)
Mean Plasma Glucose: 123 mg/dL
eAG (mmol/L): 6.8 mmol/L

## 2023-07-16 LAB — COMPREHENSIVE METABOLIC PANEL WITH GFR
AG Ratio: 1.3 (calc) (ref 1.0–2.5)
ALT: 25 U/L (ref 6–29)
AST: 17 U/L (ref 10–30)
Albumin: 4.1 g/dL (ref 3.6–5.1)
Alkaline phosphatase (APISO): 101 U/L (ref 31–125)
BUN: 15 mg/dL (ref 7–25)
CO2: 25 mmol/L (ref 20–32)
Calcium: 9.5 mg/dL (ref 8.6–10.2)
Chloride: 106 mmol/L (ref 98–110)
Creat: 0.7 mg/dL (ref 0.50–0.96)
Globulin: 3.1 g/dL (ref 1.9–3.7)
Glucose, Bld: 87 mg/dL (ref 65–99)
Potassium: 4.2 mmol/L (ref 3.5–5.3)
Sodium: 139 mmol/L (ref 135–146)
Total Bilirubin: 0.3 mg/dL (ref 0.2–1.2)
Total Protein: 7.2 g/dL (ref 6.1–8.1)
eGFR: 127 mL/min/{1.73_m2} (ref >=60)

## 2023-07-16 LAB — TSH+FREE T4: TSH W/REFLEX TO FT4: 0.81 m[IU]/L

## 2023-08-11 DIAGNOSIS — F913 Oppositional defiant disorder: Secondary | ICD-10-CM | POA: Diagnosis not present

## 2023-08-26 ENCOUNTER — Encounter: Payer: Self-pay | Admitting: Family

## 2023-08-29 ENCOUNTER — Ambulatory Visit: Admitting: Family

## 2023-08-29 ENCOUNTER — Other Ambulatory Visit: Payer: Self-pay

## 2023-08-29 VITALS — BP 106/69 | HR 57 | Ht 61.42 in | Wt 159.8 lb

## 2023-08-29 DIAGNOSIS — Z130 Encounter for screening for diseases of the blood and blood-forming organs and certain disorders involving the immune mechanism: Secondary | ICD-10-CM | POA: Diagnosis not present

## 2023-08-29 DIAGNOSIS — N6489 Other specified disorders of breast: Secondary | ICD-10-CM | POA: Diagnosis not present

## 2023-08-29 DIAGNOSIS — Z113 Encounter for screening for infections with a predominantly sexual mode of transmission: Secondary | ICD-10-CM

## 2023-08-29 DIAGNOSIS — M4003 Postural kyphosis, cervicothoracic region: Secondary | ICD-10-CM | POA: Diagnosis not present

## 2023-08-29 DIAGNOSIS — E611 Iron deficiency: Secondary | ICD-10-CM | POA: Diagnosis not present

## 2023-08-29 DIAGNOSIS — M799 Soft tissue disorder, unspecified: Secondary | ICD-10-CM

## 2023-08-29 DIAGNOSIS — Z975 Presence of (intrauterine) contraceptive device: Secondary | ICD-10-CM

## 2023-08-29 DIAGNOSIS — N921 Excessive and frequent menstruation with irregular cycle: Secondary | ICD-10-CM | POA: Diagnosis not present

## 2023-08-29 LAB — POCT HEMOGLOBIN: Hemoglobin: 11.2 g/dL (ref 11–14.6)

## 2023-08-29 MED ORDER — NORETHINDRONE ACETATE 5 MG PO TABS
5.0000 mg | ORAL_TABLET | Freq: Every day | ORAL | 1 refills | Status: DC
  Filled 2023-08-29: qty 30, 30d supply, fill #0

## 2023-08-29 NOTE — Progress Notes (Signed)
 History was provided by the patient.  Deanna Reilly is a 23 y.o. female who is here for breakthrough bleeding with Nexplanon  and request for referral for breast reduction evaluation.   PCP confirmed? Yes.    Marijean Shouts, NP  HPI:   Nexplanon  reinserted 03/2023 Thinks she has been spotting x one month  Not consistent but every day this week she has been bleeding  Spotting, not full on bleeding  Wearing a pantyliner; before really inconsistent but not bleeding throughout the day but now feels like the bleeding is ramping up   Desires breast reduction: posture and chest pain from breast tissue weight     Patient Active Problem List   Diagnosis Date Noted   Constipation 04/12/2020   Chronic midline thoracic back pain 11/06/2019   Absolute anemia 12/13/2017   ADHD (attention deficit hyperactivity disorder), combined type 09/20/2012   Generalized anxiety disorder 09/20/2012   Sleep disturbance 09/20/2012    Current Outpatient Medications on File Prior to Visit  Medication Sig Dispense Refill   albuterol  (VENTOLIN  HFA) 108 (90 Base) MCG/ACT inhaler Inhale 2 puffs into the lungs every 6 (six) hours as needed for wheezing or shortness of breath. 8 g 2   hydrOXYzine  (ATARAX ) 25 MG tablet Take 1-2 tablets (25-50 mg total) by mouth at bedtime as needed. 180 tablet 1   methylphenidate  (RITALIN ) 5 MG tablet Take 1 tablet (5 mg total) by mouth 2 (two) times daily. 60 tablet 0   polyethylene glycol powder (GLYCOLAX /MIRALAX ) 17 GM/SCOOP powder Take 1-2 capfuls daily for 1 soft bowel movement daily 578 g 6   No current facility-administered medications on file prior to visit.    No Known Allergies  Physical Exam:    Vitals:   08/29/23 1348  BP: 106/69  Pulse: (!) 57  Weight: 159 lb 12.8 oz (72.5 kg)  Height: 5' 1.42" (1.56 m)   Wt Readings from Last 3 Encounters:  08/29/23 159 lb 12.8 oz (72.5 kg)  07/13/23 165 lb (74.8 kg)  03/21/23 160 lb 6.4 oz (72.8 kg)     Growth  %ile SmartLinks can only be used for patients less than 71 years old. No LMP recorded.  Physical Exam Exam conducted with a chaperone present Carolin Chyle, RMA).  Constitutional:      General: She is not in acute distress.    Appearance: She is well-developed.  HENT:     Head: Normocephalic and atraumatic.  Eyes:     General: No scleral icterus.    Pupils: Pupils are equal, round, and reactive to light.  Neck:     Thyroid : No thyromegaly.  Cardiovascular:     Rate and Rhythm: Normal rate and regular rhythm.     Heart sounds: Normal heart sounds. No murmur heard. Pulmonary:     Effort: Pulmonary effort is normal.     Breath sounds: Normal breath sounds.  Chest:     Chest wall: No mass, deformity, swelling or tenderness.  Breasts:    Tanner Score is 5.     Breasts are symmetrical.     Right: Normal. No nipple discharge or skin change.     Left: Normal. No nipple discharge, skin change or tenderness.     Comments: Pendulous breast tissue  Abdominal:     Palpations: Abdomen is soft.  Musculoskeletal:        General: Normal range of motion.     Cervical back: Normal range of motion and neck supple.     Comments:  Cervicothoracic kyphosis   Lymphadenopathy:     Cervical: No cervical adenopathy.     Upper Body:     Right upper body: No supraclavicular, axillary or pectoral adenopathy.     Left upper body: No supraclavicular, axillary or pectoral adenopathy.  Skin:    General: Skin is warm and dry.     Findings: No rash.  Neurological:     Mental Status: She is alert and oriented to person, place, and time.     Cranial Nerves: No cranial nerve deficit.  Psychiatric:        Behavior: Behavior normal.        Thought Content: Thought content normal.        Judgment: Judgment normal.      Assessment/Plan: 1. Breakthrough bleeding on Nexplanon  (Primary) -due to upcoming travel, will Rx Aygestin  5 mg for breakthrough bleeding; discussed that bleeding is most common side effect  of implant and reassurance given that hemoglobin is stable at 11.2; thyroid  normal, lipid and cholesterol WNL; A1c decreased slightly from 6.0 a year ago to 5.9; still elevated, repeat in July.  - norethindrone  (AYGESTIN ) 5 MG tablet; Take 1 tablet (5 mg total) by mouth daily. Take as needed for breakthrough bleeding.  Dispense: 30 tablet; Refill: 1  2. Bilateral pendulous breasts 3. Postural kyphosis of cervicothoracic region 4. Postural strain -referral to plastics for consideration of breast reduction due to back strain and postural changes  - Ambulatory referral to Plastic Surgery  5. Routine screening for STI (sexually transmitted infection) - C. trachomatis/N. gonorrhoeae RNA  6. Iron  deficiency - POCT hemoglobin

## 2023-08-30 ENCOUNTER — Encounter: Payer: Self-pay | Admitting: Family

## 2023-08-30 LAB — C. TRACHOMATIS/N. GONORRHOEAE RNA
C. trachomatis RNA, TMA: NOT DETECTED
N. gonorrhoeae RNA, TMA: NOT DETECTED

## 2023-08-31 ENCOUNTER — Encounter: Payer: Self-pay | Admitting: Family

## 2023-09-05 ENCOUNTER — Encounter: Payer: Self-pay | Admitting: Family

## 2023-09-13 DIAGNOSIS — F913 Oppositional defiant disorder: Secondary | ICD-10-CM | POA: Diagnosis not present

## 2023-09-14 ENCOUNTER — Telehealth (INDEPENDENT_AMBULATORY_CARE_PROVIDER_SITE_OTHER): Admitting: Family

## 2023-09-14 ENCOUNTER — Encounter: Payer: Self-pay | Admitting: Family

## 2023-09-14 DIAGNOSIS — F411 Generalized anxiety disorder: Secondary | ICD-10-CM | POA: Diagnosis not present

## 2023-09-14 DIAGNOSIS — F902 Attention-deficit hyperactivity disorder, combined type: Secondary | ICD-10-CM | POA: Diagnosis not present

## 2023-09-14 DIAGNOSIS — N921 Excessive and frequent menstruation with irregular cycle: Secondary | ICD-10-CM | POA: Diagnosis not present

## 2023-09-14 DIAGNOSIS — Z975 Presence of (intrauterine) contraceptive device: Secondary | ICD-10-CM

## 2023-09-14 NOTE — Progress Notes (Signed)
 THIS RECORD MAY CONTAIN CONFIDENTIAL INFORMATION THAT SHOULD NOT BE RELEASED WITHOUT REVIEW OF THE SERVICE PROVIDER.  Virtual Follow-Up Visit via Video Note  I connected with Deanna Reilly and grandmother  on 09/14/23 at 10:30 AM EDT by a video enabled telemedicine application and verified that I am speaking with the correct person using two identifiers.   Patient/parent location: home Provider location: remote, [place]   I discussed the limitations of evaluation and management by telemedicine and the availability of in person appointments.  I discussed that the purpose of this telehealth visit is to provide medical care while limiting exposure to the novel coronavirus.  The grandmother and patient expressed understanding and agreed to proceed.   Deanna Reilly is a 23 y.o. female referred by Marijean Shouts, NP here today for follow-up of breakthrough bleeding on Nexplanon , anxiety.     History was provided by the patient.  Supervising Physician: Dr. Lavonda Pour   Plan from Last Visit 08/29/23:   1. Breakthrough bleeding on Nexplanon  (Primary) -due to upcoming travel, will Rx Aygestin  5 mg for breakthrough bleeding; discussed that bleeding is most common side effect of implant and reassurance given that hemoglobin is stable at 11.2; thyroid  normal, lipid and cholesterol WNL; A1c decreased slightly from 6.0 a year ago to 5.9; still elevated, repeat in July.  - norethindrone  (AYGESTIN ) 5 MG tablet; Take 1 tablet (5 mg total) by mouth daily. Take as needed for breakthrough bleeding.  Dispense: 30 tablet; Refill: 1   2. Bilateral pendulous breasts 3. Postural kyphosis of cervicothoracic region 4. Postural strain -referral to plastics for consideration of breast reduction due to back strain and postural changes  - Ambulatory referral to Plastic Surgery   5. Routine screening for STI (sexually transmitted infection) - C. trachomatis/N. gonorrhoeae RNA   6. Iron   deficiency - POCT hemoglobin  Chief Complaint:   History of Present Illness:  -continual spotting with Nexplanon   -started norethindrone   -did not take one last Thursday night and started bleeding like normal period on Friday, fully a period when she got back from trip   -one day stood up too fast and was dizzy; grandmother recommended checking blood sugar (160) and then 130/80 (BP) so got nervous about elevated reading; next day, 126/98. Felt like she should get really worried because family friend said 100 was the number to get help; called 911 and BP was normal range.   -grandmother feels she should take something for anxiety; [name] asked about propranolol or Xanax; when she took fluoxetine  she did not feel like herself; she is safe to self; just has intrusive thoughts and feels that it is difficult for her to calm down once she is worried about something; continues to see therapy.    No Known Allergies Outpatient Medications Prior to Visit  Medication Sig Dispense Refill   albuterol  (VENTOLIN  HFA) 108 (90 Base) MCG/ACT inhaler Inhale 2 puffs into the lungs every 6 (six) hours as needed for wheezing or shortness of breath. 8 g 2   hydrOXYzine  (ATARAX ) 25 MG tablet Take 1-2 tablets (25-50 mg total) by mouth at bedtime as needed. 180 tablet 1   methylphenidate  (RITALIN ) 5 MG tablet Take 1 tablet (5 mg total) by mouth 2 (two) times daily. 60 tablet 0   norethindrone  (AYGESTIN ) 5 MG tablet Take 1 tablet (5 mg total) by mouth daily. Take as needed for breakthrough bleeding. 30 tablet 1   polyethylene glycol powder (GLYCOLAX /MIRALAX ) 17 GM/SCOOP powder Take 1-2 capfuls daily  for 1 soft bowel movement daily 578 g 6   No facility-administered medications prior to visit.     Patient Active Problem List   Diagnosis Date Noted   Constipation 04/12/2020   Chronic midline thoracic back pain 11/06/2019   Absolute anemia 12/13/2017   ADHD (attention deficit hyperactivity disorder), combined  type 09/20/2012   Generalized anxiety disorder 09/20/2012   Sleep disturbance 09/20/2012   The following portions of the patient's history were reviewed and updated as appropriate: allergies, current medications, past family history, past medical history, past social history, past surgical history, and problem list.  Visual Observations/Objective:   General Appearance: Well nourished well developed, in no apparent distress.  Eyes: conjunctiva no swelling or erythema ENT/Mouth: No hoarseness, No cough for duration of visit.  Neck: Supple  Respiratory: Respiratory effort normal, normal rate, no retractions or distress.   Cardio: Appears well-perfused, noncyanotic Musculoskeletal: no obvious deformity Skin: visible skin without rashes, ecchymosis, erythema Neuro: Awake and oriented X 3,  Psych:  normal affect, Insight and Judgment appropriate.    Assessment/Plan: 1. Breakthrough bleeding on Nexplanon  (Primary) 2. ADHD (attention deficit hyperactivity disorder), combined type 3. Generalized anxiety disorder  -plan to stop using Aygestin  and determine bleeding cycle without additional progesterone; discussed consideration for pelvic ultrasound for new or worsening bleeding pattern; explained withdrawal bleed associated with stopping progesterone; she is contemplative for another method -Discussed neurotransmitters are essential for good brain function, both intellectually & emotionally.  Discussed the role of neurotransmitters in anxiety and depression symptoms and how medications work to prevent symptoms. -Discussed therapy, pharmacological and both interventions. The agents to increase the neurotransmitter levels are not addictive and simply keep this essential neurotransmitter at therapeutic levels.  If these levels become severely depleted; depression or panic attacks can occur.  Discussed time to efficacy and differences in SSRIs, SNRIs, and dopaminergic agents.  Reviewed adverse  effects, including BBW and return precautions.  [name] is agreeable to consider Lexapro or Sertraline  with plan to return to clinic if she decides to start medication.    Follow-up:   as needed    Marijean Shouts, NP    CC: Marijean Shouts, NP, Marijean Shouts, NP

## 2023-09-20 ENCOUNTER — Encounter: Payer: Self-pay | Admitting: Family

## 2023-09-21 ENCOUNTER — Other Ambulatory Visit: Payer: Self-pay | Admitting: Family

## 2023-09-21 DIAGNOSIS — R519 Headache, unspecified: Secondary | ICD-10-CM

## 2023-09-21 DIAGNOSIS — G479 Sleep disorder, unspecified: Secondary | ICD-10-CM

## 2023-10-01 ENCOUNTER — Encounter: Payer: Self-pay | Admitting: Family

## 2023-10-08 ENCOUNTER — Encounter: Payer: Self-pay | Admitting: Family

## 2023-10-11 ENCOUNTER — Encounter: Payer: Self-pay | Admitting: Family

## 2023-10-13 DIAGNOSIS — F913 Oppositional defiant disorder: Secondary | ICD-10-CM | POA: Diagnosis not present

## 2023-10-19 ENCOUNTER — Encounter: Payer: Self-pay | Admitting: Family

## 2023-10-19 ENCOUNTER — Telehealth: Payer: Self-pay | Admitting: Family

## 2023-10-19 DIAGNOSIS — N921 Excessive and frequent menstruation with irregular cycle: Secondary | ICD-10-CM | POA: Diagnosis not present

## 2023-10-19 DIAGNOSIS — F411 Generalized anxiety disorder: Secondary | ICD-10-CM

## 2023-10-19 MED ORDER — ESCITALOPRAM OXALATE 10 MG PO TABS: ORAL_TABLET | ORAL | 0 refills | Status: DC

## 2023-10-19 NOTE — Progress Notes (Cosign Needed Addendum)
 THIS RECORD MAY CONTAIN CONFIDENTIAL INFORMATION THAT SHOULD NOT BE RELEASED WITHOUT REVIEW OF THE SERVICE PROVIDER.  Virtual Follow-Up Visit via Video Note  I connected with Deanna Reilly on 10/19/23 at  2:30 PM EDT by a video enabled telemedicine application and verified that I am speaking with the correct person using two identifiers.   Patient/parent location: at work but in a private setting Provider location: remote, [place]   I discussed the limitations of evaluation and management by telemedicine and the availability of in person appointments.  I discussed that the purpose of this telehealth visit is to provide medical care while limiting exposure to the novel coronavirus.  The patient expressed understanding and agreed to proceed.   Deanna Reilly is a 23 y.o. female referred by Joshua Bari HERO, NP here today for follow-up of breakthrough bleeding on Nexplanon , anxiety.   History was provided by the patient.  Supervising Physician: Dr. Kreg Helena    Chief Complaint: Anxiety  History of Present Illness:   Wondering if she has been having panic attacks. There are times throughout the day where she feels her heart rate pick up. Does breathing techniques which help some. Using an ice pack also seems to help a lot. These have been happening since June. Happens randomly throughout the day. No identifiable triggers. Still seeing her therapist periodically, not as often as before (every couple months).   Also having some episodes of dizziness and nausea. Not able to pinpoint what causes this. Goes away on its own. Has dramamine but does not take it. Has not passed out at all.   Drinking a lot of water and trying to stay hydrated. Got a new hydroflask and drinking 40oz of water or more per day. Sometimes does liquid IV.   No more spotting with her Nexplanon . Very rare cramping. No periods. Has not been needing the Aygestin .   Sleep has been a little off  lately but mostly due to somewhat erratic work schedule. Schedule is getting better starting this week.   Mood is decent. Does feel that she has some anxiety and attributes some of this to not feeling as close as she would like to her coworkers. Feels somewhat uncomfortable around them. 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   Previously was not interested in a medication for anxiety due to not wanting the stress of having to take a medication every day. Has been reading about lexapro  and zoloft  and is particularly interested in lexapro  to start with.    Has appointments coming up in the next few months with Neuro for headaches and Plastic Surgery for possible breast reduction.   Not currently sexually active, no alcohol, no tobacco, no vaping, no recreational drugs.    No Known Allergies Outpatient Medications Prior to Visit  Medication Sig Dispense Refill   albuterol  (VENTOLIN  HFA) 108 (90 Base) MCG/ACT inhaler Inhale 2 puffs into the lungs every 6 (six) hours as needed for wheezing or shortness of breath. 8 g 2   hydrOXYzine  (ATARAX ) 25 MG tablet Take 1-2 tablets (25-50 mg total) by mouth at bedtime as needed. 180 tablet 1   methylphenidate  (RITALIN ) 5 MG tablet Take 1 tablet (5 mg total) by mouth 2 (two) times daily. 60 tablet 0   norethindrone  (AYGESTIN ) 5 MG tablet Take 1 tablet (5 mg total) by mouth daily. Take  as needed for breakthrough bleeding. 30 tablet 1   polyethylene glycol powder (GLYCOLAX /MIRALAX ) 17 GM/SCOOP powder Take 1-2 capfuls daily for 1 soft bowel movement daily 578 g 6   No facility-administered medications prior to visit.     Patient Active Problem List   Diagnosis Date Noted   Constipation 04/12/2020   Chronic midline thoracic back pain 11/06/2019   Absolute anemia 12/13/2017   ADHD (attention deficit hyperactivity disorder), combined  type 09/20/2012   Generalized anxiety disorder 09/20/2012   Sleep disturbance 09/20/2012   The following portions of the patient's history were reviewed and updated as appropriate: allergies, current medications, past family history, past medical history, past social history, past surgical history, and problem list.  Visual Observations/Objective:   General Appearance: Well nourished well developed, in no apparent distress.  Eyes: clear conjunctiva, no swelling or erythema ENT/Mouth: No hoarseness, No cough for duration of visit.  Respiratory: Respiratory effort normal, normal rate, no retractions or distress.   Cardio: Appears well-perfused, noncyanotic Musculoskeletal: no obvious deformity Skin: visible skin without rashes, ecchymosis, erythema Neuro: Awake and oriented X 3,  Psych:  normal affect, Insight and Judgment appropriate.    Assessment/Plan: 1. Breakthrough bleeding on Nexplanon  (Primary) No longer having breakthrough bleeding and is overall very happy with how things are going with the Nexplanon . Very rare mild cramping. Not currently sexually active, no concern for STIs at this time.  -continue with Nexplanon    2. Generalized anxiety disorder Feels that her anxiety has worsened recently, partially due to stressors with her job (which will end in about 3 weeks). Also now having periods where she feels more panicked and her heart starts racing--improves with breathing techniques or ice packs. Seeing her therapist every few months. Interested in starting Lexapro  after taking some time to review information about both Lexapro  and Zoloft .  -encouraged her to continue seeing her therapist and encouraged more regular visits as her schedule allows  -will start Lexapro  at this time (start with 5 mg x one week, then increase to 10 mg daily.  -reviewed adverse effects and return precautions -will plan for follow up in 2 weeks  Follow-up:2 weeks.   Schuyler Mayotte, MD   Supervising  Provider Co-Signature  I reviewed with the resident the medical history and the resident's findings on physical examination.  I discussed with the resident the patient's diagnosis and concur with the treatment plan as documented in the resident's note.  Bari CHRISTELLA Molt, NP

## 2023-10-27 ENCOUNTER — Encounter: Payer: Self-pay | Admitting: Family

## 2023-11-09 ENCOUNTER — Encounter: Payer: Self-pay | Admitting: Family

## 2023-11-11 ENCOUNTER — Encounter: Payer: Self-pay | Admitting: Family

## 2023-11-11 ENCOUNTER — Telehealth: Admitting: Family

## 2023-11-11 DIAGNOSIS — F411 Generalized anxiety disorder: Secondary | ICD-10-CM

## 2023-11-11 DIAGNOSIS — F913 Oppositional defiant disorder: Secondary | ICD-10-CM | POA: Diagnosis not present

## 2023-11-11 NOTE — Progress Notes (Signed)
 THIS RECORD MAY CONTAIN CONFIDENTIAL INFORMATION THAT SHOULD NOT BE RELEASED WITHOUT REVIEW OF THE SERVICE PROVIDER.  Virtual Follow-Up Visit via Video Note  I connected with Deanna Reilly  on 11/11/23 at  7:30 AM EDT by a video enabled telemedicine application and verified that I am speaking with the correct person using two identifiers.   Patient/parent location: [employer], confidential area Provider location: remote, [place]   I discussed the limitations of evaluation and management by telemedicine and the availability of in person appointments.  I discussed that the purpose of this telehealth visit is to provide medical care while limiting exposure to the novel coronavirus.  The patient expressed understanding and agreed to proceed.   Deanna Reilly is a 23 y.o. female referred by Joshua Bari HERO, NP here today for follow-up of GAD.   History was provided by the patient.  Supervising Physician: Dr. Kreg Helena   Plan from Last Visit:   1. Breakthrough bleeding on Nexplanon  (Primary) No longer having breakthrough bleeding and is overall very happy with how things are going with the Nexplanon . Very rare mild cramping. Not currently sexually active, no concern for STIs at this time.  -continue with Nexplanon     2. Generalized anxiety disorder Feels that her anxiety has worsened recently, partially due to stressors with her job (which will end in about 3 weeks). Also now having periods where she feels more panicked and her heart starts racing--improves with breathing techniques or ice packs. Seeing her therapist every few months. Interested in starting Lexapro  after taking some time to review information about both Lexapro  and Zoloft .  -encouraged her to continue seeing her therapist and encouraged more regular visits as her schedule allows  -will start Lexapro  at this time (start with 5 mg x one week, then increase to 10 mg daily.  -reviewed adverse effects and return  precautions -will plan for follow up in 2 weeks   Follow-up:2 weeks.    Chief Complaint: Anxiety is better  History of Present Illness:  -could tell that intrusive thoughts were less when taking the medication but also feels the work environment at [employer] was contributing to her anxiety  -last dose was last Friday; no side effects from stopping the medication  -anxiety has remained better  -last night she did have panic attack after she awoke from braces pain and got up to take pain reliever; when she went back to bed her thoughts were that she took too much and was going to overdose and she was able to calm herself down during that time -safe to self  No Known Allergies Outpatient Medications Prior to Visit  Medication Sig Dispense Refill   albuterol  (VENTOLIN  HFA) 108 (90 Base) MCG/ACT inhaler Inhale 2 puffs into the lungs every 6 (six) hours as needed for wheezing or shortness of breath. 8 g 2   escitalopram  (LEXAPRO ) 10 MG tablet Take 1/2 tablet (5 mg) by mouth for one week, then increase to one tablet (10 mg) daily. 30 tablet 0   hydrOXYzine  (ATARAX ) 25 MG tablet Take 1-2 tablets (25-50 mg total) by mouth at bedtime as needed. 180 tablet 1   methylphenidate  (RITALIN ) 5 MG tablet Take 1 tablet (5 mg total) by mouth 2 (two) times daily. 60 tablet 0   norethindrone  (AYGESTIN ) 5 MG tablet Take 1 tablet (5 mg total) by mouth daily. Take as needed for breakthrough bleeding. 30 tablet 1   polyethylene glycol powder (GLYCOLAX /MIRALAX ) 17 GM/SCOOP powder Take 1-2 capfuls daily for  1 soft bowel movement daily 578 g 6   No facility-administered medications prior to visit.     Patient Active Problem List   Diagnosis Date Noted   Constipation 04/12/2020   Chronic midline thoracic back pain 11/06/2019   Absolute anemia 12/13/2017   ADHD (attention deficit hyperactivity disorder), combined type 09/20/2012   Generalized anxiety disorder 09/20/2012   Sleep disturbance 09/20/2012   The  following portions of the patient's history were reviewed and updated as appropriate: allergies, current medications, past family history, past medical history, past social history, past surgical history, and problem list.  Visual Observations/Objective:   General Appearance: Well nourished well developed, in no apparent distress.  Eyes: conjunctiva no swelling or erythema ENT/Mouth: No hoarseness, No cough for duration of visit.  Neck: Supple  Respiratory: Respiratory effort normal, normal rate, no retractions or distress.   Cardio: Appears well-perfused, noncyanotic Musculoskeletal: no obvious deformity Skin: visible skin without rashes, ecchymosis, erythema Neuro: Awake and oriented X 3,  Psych:  normal affect, Insight and Judgment appropriate.    Assessment/Plan:  1. Generalized anxiety disorder (Primary) -last dose of Lexapro  was last Saturday -advised to continue acknowledging the negative thoughts and challenging them when they come; she may benefit from restarting medication pending school start if anxiety symptoms worsen -return precautions reviewed  I discussed the assessment and treatment plan with the patient and/or parent/guardian.  They were provided an opportunity to ask questions and all were answered.  They agreed with the plan and demonstrated an understanding of the instructions. They were advised to call back or seek an in-person evaluation in the emergency room if the symptoms worsen or if the condition fails to improve as anticipated.   Follow-up:   will reach out through My Chart as needed   Bari CHRISTELLA Molt, NP    CC: Molt Bari CHRISTELLA, NP, Molt Bari CHRISTELLA, NP

## 2023-11-30 ENCOUNTER — Telehealth

## 2023-11-30 DIAGNOSIS — K5909 Other constipation: Secondary | ICD-10-CM | POA: Diagnosis not present

## 2023-11-30 MED ORDER — POLYETHYLENE GLYCOL 3350 17 GM/SCOOP PO POWD: ORAL | 6 refills | Status: AC

## 2023-11-30 NOTE — Patient Instructions (Signed)
 Deanna Reilly, thank you for joining Delon CHRISTELLA Dickinson, PA-C for today's virtual visit.  While this provider is not your primary care provider (PCP), if your PCP is located in our provider database this encounter information will be shared with them immediately following your visit.   A [facility] MyChart account gives you access to today's visit and all your visits, tests, and labs performed at [facility]  click here if you don't have a [facility] MyChart account or go to [email or web address]  Consent: (Patient) Deanna Reilly provided verbal consent for this virtual visit at the beginning of the encounter.  Current Medications:  Current Outpatient Medications:    albuterol  (VENTOLIN  HFA) 108 (90 Base) MCG/ACT inhaler, Inhale 2 puffs into the lungs every 6 (six) hours as needed for wheezing or shortness of breath., Disp: 8 g, Rfl: 2   escitalopram  (LEXAPRO ) 10 MG tablet, Take 1/2 tablet (5 mg) by mouth for one week, then increase to one tablet (10 mg) daily., Disp: 30 tablet, Rfl: 0   hydrOXYzine  (ATARAX ) 25 MG tablet, Take 1-2 tablets (25-50 mg total) by mouth at bedtime as needed., Disp: 180 tablet, Rfl: 1   methylphenidate  (RITALIN ) 5 MG tablet, Take 1 tablet (5 mg total) by mouth 2 (two) times daily., Disp: 60 tablet, Rfl: 0   norethindrone  (AYGESTIN ) 5 MG tablet, Take 1 tablet (5 mg total) by mouth daily. Take as needed for breakthrough bleeding., Disp: 30 tablet, Rfl: 1   polyethylene glycol powder (GLYCOLAX /MIRALAX ) 17 GM/SCOOP powder, Take 1-2 capfuls daily for 1 soft bowel movement daily, Disp: 578 g, Rfl: 6   Medications ordered in this encounter:  Meds ordered this encounter  Medications   polyethylene glycol powder (GLYCOLAX /MIRALAX ) 17 GM/SCOOP powder    Sig: Take 1-2 capfuls daily for 1 soft bowel movement daily    Dispense:  578 g    Refill:  6    Supervising Provider:   BLAISE ALEENE KIDD [[id number]]     *If you need refills on other  medications prior to your next appointment, please contact your pharmacy*  Follow-Up: Call back or seek an in-person evaluation if the symptoms worsen or if the condition fails to improve as anticipated.  [facility] [phone]  Other Instructions  Chronic Constipation Chronic constipation is when a person poops 3 times a week or less for 3 months or longer. It is most common in older adults. What are the causes? This condition may be caused by: Not drinking enough fluid, eating enough fiber, or getting enough exercise. Pregnancy. An anal fissure. This is a tear in the opening of the butt (anus). Bowel obstruction. This is when there is a blockage in your intestine (bowel). Bowel stricture. This is when your bowel narrows. A long-term (chronic) condition, such as: Diabetes or hypothyroidism. A stroke or injury to the spinal cord. Multiple sclerosis or Parkinson's disease. Colon cancer. Dementia. Inflammatory bowel disease (IBD), rectal prolapse, or hemorrhoids. Taking certain medicines. These include: Some pain medicines. Antacids or iron  supplements. Water pills (diuretics). Some blood pressure medicines. Medicines to keep you from having seizures (anti-seizure medicines). Antidepressants. Medicines for Parkinson's disease. Other causes may include: Stress. Problems with the nerves and muscles that help you poop. Weak muscles in the area between your hip bones (pelvis). What increases the risk? You may be more at risk for this condition if: You are older than 21 years of age. You are female. You live in a long-term care facility. You have  a chronic condition. You have a mental health disorder or eating disorder. What are the signs or symptoms? The main sign of this condition is pooping 3 times a week or less. Other signs and symptoms may include: Pushing hard (straining) to poop. Hard or lumpy poop (stool). Pain when you poop. Discomfort in your  lower abdomen. This may include cramps and bloating. Not being able to poop when you need to. Feeling like you still need to poop after you are done. Feeling like there is something in your rectum that is blocking or stopping you from pooping. Blood in your poop or seeing blood on the toilet paper after you poop. Confusion. This is most common in older adults. How is this diagnosed? This condition may be diagnosed based on your symptoms and medical history. You may also need: An exam of your abdomen. A digital rectal exam. For this exam, your health care provider will put a gloved finger into your rectum. Testing. Tests may include: Imaging studies, such as an X-ray, ultrasound, or CT scan. Blood tests. A colonoscopy. This is a test that looks at your colon. A test of how well your anal sphincter works. This is a muscle shaped like a ring. It helps your anus open and close. A test of how well food moves through your colon. An electromyography. This is a test of the nerves in your pelvis. How is this treated? Treatment depends on the cause. You may need to: Be more active. Drink more fluids. Add fiber to your diet. Sources of fiber include fruits, vegetables, and whole grains. You may also need to take a fiber supplement. Stop or change medicines that can cause constipation. Take medicines, such as: Pro-motility agents. These help your muscles tighten (contract). This can help you push out poop. Stool softeners. These soften your poop. An osmotic laxative. A laxative is a medicine that helps you poop. This type works by [employer] into your colon. Train the muscles in your pelvis with biofeedback. Have surgery. This may be done if something is blocking your bowels. You may also need to see an expert in problems with the digestive system (gastroenterologist). Follow these instructions at home: Medicines Take over-the-counter and prescription medicines only as told by your  provider. If you are taking a laxative, take it only as told by your provider. Eating and drinking  Drink enough fluid to keep your pee (urine) pale yellow. Eat foods that are high in fiber, such as beans, whole grains, and fresh fruits and vegetables. Limit foods that are high in fat and processed sugars, such as fried or sweet foods. Stay away from alcohol, caffeine, and soda. These can make constipation worse. General instructions Get physical activity every day. Ask your provider what activities are safe for you. Get colon cancer screenings as told by your provider. Contact a health care provider if: You poop 3 times a week or less. Your poop is hard or lumpy. There is blood on the toilet paper or in your poop. You lose weight for no clear reason. You have pain in your rectum. Poop is leaking out of your rectum. You have nausea or vomiting. This information is not intended to replace advice given to you by your health care provider. Make sure you discuss any questions you have with your health care provider. Document Revised: 01/11/2022 Document Reviewed: 01/11/2022 Elsevier Patient Education  2024 [employer].   If you have been instructed to have an in-person evaluation today at a local  Urgent Care facility, please use the link below. It will take you to a list of all of our available [facility], including address, phone number and hours of operation. Please do not delay care.  [facility]  If you or a family member do not have a primary care provider, use the link below to schedule a visit and establish care. When you choose a [facility] primary care physician or advanced practice provider, you gain a long-term partner in health. Find a Primary Care Provider  Learn more about [facility]'s in-office and virtual care options: [place] [facility] - Get Care Now

## 2023-11-30 NOTE — Progress Notes (Signed)
 Virtual Visit Consent   Deanna Reilly, you are scheduled for a virtual visit with a  provider today. Just as with appointments in the office, your consent must be obtained to participate. Your consent will be active for this visit and any virtual visit you may have with one of our providers in the next 365 days. If you have a MyChart account, a copy of this consent can be sent to you electronically.  As this is a virtual visit, video technology does not allow for your provider to perform a traditional examination. This may limit your provider's ability to fully assess your condition. If your provider identifies any concerns that need to be evaluated in person or the need to arrange testing (such as labs, EKG, etc.), we will make arrangements to do so. Although advances in technology are sophisticated, we cannot ensure that it will always work on either your end or our end. If the connection with a video visit is poor, the visit may have to be switched to a telephone visit. With either a video or telephone visit, we are not always able to ensure that we have a secure connection.  By engaging in this virtual visit, you consent to the provision of healthcare and authorize for your insurance to be billed (if applicable) for the services provided during this visit. Depending on your insurance coverage, you may receive a charge related to this service.  I need to obtain your verbal consent now. Are you willing to proceed with your visit today? Deanna Reilly has provided verbal consent on 11/30/2023 for a virtual visit (video or telephone). Deanna CHRISTELLA Dickinson, PA-C  Date: 11/30/2023 10:26 AM   Virtual Visit via Video Note   I, Deanna Reilly, connected with  Deanna Reilly  ([id number], 06/28/2000) on 11/30/23 at 10:00 AM EDT by a video-enabled telemedicine application and verified that I am speaking with the correct person using two identifiers.  Location: Patient:  Virtual Visit Location Patient: Home Provider: Virtual Visit Location Provider: Home Office   I discussed the limitations of evaluation and management by telemedicine and the availability of in person appointments. The patient expressed understanding and agreed to proceed.    History of Present Illness: Deanna Reilly is a 23 y.o. who identifies as a female who was assigned female at birth, and is being seen today for constipation.  HPI: Constipation This is a recurrent problem. The current episode started more than 1 year ago. The problem has been waxing and waning since onset. Her stool frequency is 2 to 3 times per week (Had not had a BM in over 3 days, took Miralax -1cap, and Prune Juice without relief. Took Dulcolax overnight last night. Is having a BM this morning). The stool is described as firm and pellet like. The patient is not on a high fiber diet. She Does not exercise regularly. There has Not been adequate water intake. Associated symptoms include abdominal pain, bloating and rectal pain. Pertinent negatives include no back pain, diarrhea, fever, flatus, hematochezia, hemorrhoids, melena or nausea. She has tried laxatives (Dulcolax suppositories and Miralax ) for the symptoms. The treatment provided mild relief.    Problems:  Patient Active Problem List   Diagnosis Date Noted   Constipation 04/12/2020   Chronic midline thoracic back pain 11/06/2019   Absolute anemia 12/13/2017   ADHD (attention deficit hyperactivity disorder), combined type 09/20/2012   Generalized anxiety disorder 09/20/2012   Sleep disturbance 09/20/2012    Allergies: No Known Allergies  Medications:  Current Outpatient Medications:    albuterol  (VENTOLIN  HFA) 108 (90 Base) MCG/ACT inhaler, Inhale 2 puffs into the lungs every 6 (six) hours as needed for wheezing or shortness of breath., Disp: 8 g, Rfl: 2   escitalopram  (LEXAPRO ) 10 MG tablet, Take 1/2 tablet (5 mg) by mouth for one week, then increase to  one tablet (10 mg) daily., Disp: 30 tablet, Rfl: 0   hydrOXYzine  (ATARAX ) 25 MG tablet, Take 1-2 tablets (25-50 mg total) by mouth at bedtime as needed., Disp: 180 tablet, Rfl: 1   methylphenidate  (RITALIN ) 5 MG tablet, Take 1 tablet (5 mg total) by mouth 2 (two) times daily., Disp: 60 tablet, Rfl: 0   norethindrone  (AYGESTIN ) 5 MG tablet, Take 1 tablet (5 mg total) by mouth daily. Take as needed for breakthrough bleeding., Disp: 30 tablet, Rfl: 1   polyethylene glycol powder (GLYCOLAX /MIRALAX ) 17 GM/SCOOP powder, Take 1-2 capfuls daily for 1 soft bowel movement daily, Disp: 578 g, Rfl: 6  Observations/Objective: Patient is well-developed, well-nourished in no acute distress.  Resting comfortably at home.  Head is normocephalic, atraumatic.  No labored breathing.  Speech is clear and coherent with logical content.  Patient is alert and oriented at baseline.    Assessment and Plan: 1. Chronic constipation (Primary) - polyethylene glycol powder (GLYCOLAX /MIRALAX ) 17 GM/SCOOP powder; Take 1-2 capfuls daily for 1 soft bowel movement daily  Dispense: 578 g; Refill: 6  - Advised to use Miralax  more regularly for now - May use 2 capfuls until having soft serve consistency BM then decrease use - Increase fluid intake - Increase dietary fiber or use a fiber supplement - Avoid caffeine and sugar loaded drinks - Advised to follow up with PCP, may benefit from GI referral for work-up since not been done as of yet - Seek in person evaluation if symptoms worsen  Follow Up Instructions: I discussed the assessment and treatment plan with the patient. The patient was provided an opportunity to ask questions and all were answered. The patient agreed with the plan and demonstrated an understanding of the instructions.  A copy of instructions were sent to the patient via MyChart unless otherwise noted below.    The patient was advised to call back or seek an in-person evaluation if the symptoms worsen or  if the condition fails to improve as anticipated.    Deanna CHRISTELLA Dickinson, PA-C

## 2023-12-04 ENCOUNTER — Encounter: Payer: Self-pay | Admitting: Family

## 2023-12-05 ENCOUNTER — Other Ambulatory Visit: Payer: Self-pay | Admitting: Family

## 2023-12-05 MED ORDER — METHYLPHENIDATE HCL 5 MG PO TABS: 5.0000 mg | ORAL_TABLET | Freq: Two times a day (BID) | ORAL | 0 refills | Status: DC

## 2023-12-05 NOTE — Progress Notes (Signed)
 Refill request sent. PDMP reviewed.

## 2023-12-06 ENCOUNTER — Institutional Professional Consult (permissible substitution): Payer: Self-pay | Admitting: Neurology

## 2023-12-08 ENCOUNTER — Institutional Professional Consult (permissible substitution): Admitting: Plastic Surgery

## 2023-12-09 ENCOUNTER — Institutional Professional Consult (permissible substitution): Admitting: Plastic Surgery

## 2023-12-15 ENCOUNTER — Ambulatory Visit: Payer: Self-pay | Admitting: Neurology

## 2023-12-15 VITALS — BP 110/62 | HR 66 | Ht 61.5 in | Wt 154.0 lb

## 2023-12-15 DIAGNOSIS — G4719 Other hypersomnia: Secondary | ICD-10-CM | POA: Diagnosis not present

## 2023-12-15 DIAGNOSIS — R519 Headache, unspecified: Secondary | ICD-10-CM

## 2023-12-15 DIAGNOSIS — Z9189 Other specified personal risk factors, not elsewhere classified: Secondary | ICD-10-CM

## 2023-12-15 DIAGNOSIS — R6889 Other general symptoms and signs: Secondary | ICD-10-CM

## 2023-12-15 DIAGNOSIS — G478 Other sleep disorders: Secondary | ICD-10-CM | POA: Diagnosis not present

## 2023-12-15 NOTE — Progress Notes (Signed)
 Subjective:    Patient ID: Deanna Reilly is a 23 y.o. female.  HPI    True Mar, MD, PhD [facility] [address], [address] [address] [place], [place] [address]  Dear Bari,   I saw your patient, Deanna Reilly, upon your kind request in my sleep clinic today for initial consultation of her sleep disorder, in particular, concern for underlying obstructive sleep apnea.  The patient is unaccompanied today.  As you know, [name] is a 23 year old female with an underlying medical history of ADHD, asthma, anxiety and overweight state, who reports a history of daytime sleepiness since high school years.  She denies any sleep attacks, typically schedules a nap in the afternoons but typically does not fall asleep on a whim, without warning.  She reports no family history of narcolepsy or sleep apnea.  She does not have any telltale symptoms of cataplexy, does have some vivid dreams and may have had occasional sleep paralysis.  She denies any frank snoring but has been known to make groaning noises while asleep.  She does not drink caffeine daily.  She does not drink any alcohol.  She does not smoke cigarettes or use any other vaping devices or THC or CBD products.  She does sleep with the TV on and it turns off on a timer.   Her Epworth sleepiness score is 15 out of 24, fatigue severity score is 41 out of 63.  She is currently no longer on hydroxyzine .  She is on Ritalin  as needed and stopped taking Lexapro  in July.  I reviewed your office note from 09/14/2023. She has a history of migraine headaches and takes ibuprofen  as needed.  If she goes to bed with a headache she may wake up with a headache. She is in college at [employer].  She goes to bed around 11 PM or midnight and rise time is around 7 or 8.  She denies nightly nocturia.  She naps nearly daily, sometimes 2 to 3 hours long.  Her Past Medical History Is Significant For: Past Medical History:  Diagnosis  Date   ADHD    Asthma     Her Past Surgical History Is Significant For: No past surgical history on file.  Her Family History Is Significant For: Family History  Adopted: Yes    Her Social History Is Significant For: Social History   Socioeconomic History   Marital status: Single    Spouse name: Not on file   Number of children: Not on file   Years of education: Not on file   Highest education level: Not on file  Occupational History   Not on file  Tobacco Use   Smoking status: Never   Smokeless tobacco: Never   Tobacco comments:    only rarely when dad visits. no smoke exp on daily basis.  Substance and Sexual Activity   Alcohol use: No    Alcohol/week: 0.0 standard drinks of alcohol   Drug use: No   Sexual activity: Never  Other Topics Concern   Not on file  Social History Narrative   ** Merged History Encounter **       Social Drivers of Health   Financial Resource Strain: Not on file  Food Insecurity: No Food Insecurity (04/17/2020)   Hunger Vital Sign    Worried About Running Out of Food in the Last Year: Never true    Ran Out of Food in the Last Year: Never true  Transportation Needs: Unmet  Transportation Needs (12/15/2022)   PRAPARE - Administrator, Civil Service (Medical): Yes    Lack of Transportation (Non-Medical): Yes  Physical Activity: Not on file  Stress: Not on file  Social Connections: Not on file    Her Allergies Are:  No Known Allergies:   Her Current Medications Are:  Outpatient Encounter Medications as of 12/15/2023  Medication Sig   albuterol  (VENTOLIN  HFA) 108 (90 Base) MCG/ACT inhaler Inhale 2 puffs into the lungs every 6 (six) hours as needed for wheezing or shortness of breath.   escitalopram  (LEXAPRO ) 10 MG tablet Take 1/2 tablet (5 mg) by mouth for one week, then increase to one tablet (10 mg) daily.   methylphenidate  (RITALIN ) 5 MG tablet Take 1 tablet (5 mg total) by mouth 2 (two) times daily.   polyethylene glycol  powder (GLYCOLAX /MIRALAX ) 17 GM/SCOOP powder Take 1-2 capfuls daily for 1 soft bowel movement daily   norethindrone  (AYGESTIN ) 5 MG tablet Take 1 tablet (5 mg total) by mouth daily. Take as needed for breakthrough bleeding. (Patient not taking: Reported on 12/15/2023)   [DISCONTINUED] hydrOXYzine  (ATARAX ) 25 MG tablet Take 1-2 tablets (25-50 mg total) by mouth at bedtime as needed.   No facility-administered encounter medications on file as of 12/15/2023.  :   Review of Systems:  Out of a complete 14 point review of systems, all are reviewed and negative with the exception of these symptoms as listed below:   Review of Systems  Neurological:        Patient presents today to discuss her sleep. Patient denies having a sleep study. Patient denies snoring but does moan and groan in her sleep. Patient complaining of migraines or headaches a couple times a month.  ESS: 15 FSS: 41    Objective:  Neurological Exam  Physical Exam Physical Examination:   Vitals:   12/15/23 1525  BP: 110/62  Pulse: 66    General Examination: The patient is a very pleasant 23 y.o. female in no acute distress. She appears well-developed and well-nourished and well groomed.   HEENT: Normocephalic, atraumatic, pupils are equal, round and reactive to light, extraocular tracking is good without limitation to gaze excursion or nystagmus noted. Hearing is grossly intact. Face is symmetric with normal facial animation. Speech is clear with no dysarthria noted. There is no hypophonia. There is no lip, neck/head, jaw or voice tremor. Neck is supple with full range of passive and active motion. There are no carotid bruits on auscultation. Oropharynx exam reveals: mild mouth dryness, good dental hygiene and mild airway crowding, due to smaller airway entry, tonsillar size about 1+, slightly wider uvula and slightly wider tongue.  Tongue protrudes centrally and palate elevates symmetrically, Mallampati class I, neck  circumference 14-1/4 inches, braces in place.  Chest: Clear to auscultation without wheezing, rhonchi or crackles noted.  Heart: S1+S2+0, regular and normal without murmurs, rubs or gallops noted.   Abdomen: Soft, non-tender and non-distended.  Extremities: There is no pitting edema in the distal lower extremities bilaterally.   Skin: Warm and dry without trophic changes noted.   Musculoskeletal: exam reveals no obvious joint deformities.   Neurologically:  Mental status: The patient is awake, alert and oriented in all 4 spheres. Her immediate and remote memory, attention, language skills and fund of knowledge are appropriate. There is no evidence of aphasia, agnosia, apraxia or anomia. Speech is clear with normal prosody and enunciation. Thought process is linear. Mood is normal and affect is normal.  Cranial  nerves II - XII are as described above under HEENT exam.  Motor exam: Normal bulk, strength and tone is noted. There is no obvious action or resting tremor.  Fine motor skills and coordination: grossly intact.  Cerebellar testing: No dysmetria or intention tremor. There is no truncal or gait ataxia.  Sensory exam: intact to light touch in the upper and lower extremities.  Gait, station and balance: She stands easily. No veering to one side is noted. No leaning to one side is noted. Posture is age-appropriate and stance is narrow based. Gait shows normal stride length and normal pace. No problems turning are noted.   Assessment and Plan:   In summary, Deanna Reilly is a very pleasant 23 y.o.-year old female with an underlying medical history of ADHD, asthma, anxiety and overweight state, who presents for evaluation of her sleep disturbance.  Underlying sleep disordered breathing is not excluded, she does report daytime somnolence since high school years but does not believe she would be able to sleep during a daytime sleep study or nap test.  I outlined the possibility of doing  evaluation for excessive somnolence with a nighttime baseline sleep study with the next day nap test.  She would like to proceed with a baseline sleep study at this time.  We talked about sleep apnea, its prognosis and treatment options at length today.  She would be willing to try a PAP machine if need be.  We talked about the importance of maintaining a healthy lifestyle and a good sleep schedule and hygiene.  We talked about medical/conservative treatments, surgical interventions and non-pharmacological approaches for symptom control and sleep apnea treatment. I explained, in particular, the risks and ramifications of untreated moderate to severe OSA, especially with respect to developing cardiovascular disease down the road, including congestive heart failure (CHF), difficult to treat hypertension, cardiac arrhythmias (particularly A-fib), neurovascular complications including TIA, stroke and dementia. Even type 2 diabetes has, in part, been linked to untreated OSA. Symptoms of untreated OSA may include (but may not be limited to) daytime sleepiness, nocturia (i.e. frequent nighttime urination), memory problems, mood irritability and suboptimally controlled or worsening mood disorder such as depression and/or anxiety, lack of energy, lack of motivation, physical discomfort, as well as recurrent headaches, especially morning or nocturnal headaches.  I recommended a sleep study at this time. I outlined the differences between a laboratory attended sleep study which is considered more comprehensive and accurate over the option of a home sleep test (HST); the latter may lead to underestimation of sleep disordered breathing in some instances and does not help with diagnosing upper airway resistance syndrome and is not accurate enough to diagnose primary central sleep apnea typically. I outlined possible surgical and non-surgical treatment options of OSA, including the use of a positive airway pressure (PAP) device  (i.e. CPAP, AutoPAP/APAP or BiPAP in certain circumstances), a custom-made dental device (aka oral appliance, which would require a referral to a specialist dentist or orthodontist typically, and is generally speaking not considered for patients with full dentures or edentulous state), upper airway surgical options, such as traditional UPPP (which is not considered a first-line treatment) or the Inspire device (hypoglossal nerve stimulator, which would involve a referral for consultation with an ENT surgeon, after careful selection, following inclusion criteria - also not first-line treatment). I explained the PAP treatment option to the patient in detail, as this is generally considered first-line treatment.  The patient indicated that she would be willing to try PAP therapy,  if the need arises. I explained the importance of being compliant with PAP treatment, not only for insurance purposes but primarily to improve patient's symptoms symptoms, and for the patient's long term health benefit, including to reduce Her cardiovascular risks longer-term.    We will pick up our discussion about the next steps and treatment options after testing.  We will keep her posted as to the test results by phone call and/or MyChart messaging where possible.  We will plan to follow-up in sleep clinic accordingly as well.  I answered all her questions today and the patient was in agreement.   I encouraged her to call with any interim questions, concerns, problems or updates or email us  through MyChart.  Generally speaking, sleep test authorizations may take up to 2 weeks, sometimes less, sometimes longer, the patient is encouraged to get in touch with us  if they do not hear back from the sleep lab staff directly within the next 2 weeks.  Thank you very much for allowing me to participate in the care of this nice patient. If I can be of any further assistance to you please do not hesitate to call me at  [phone].  Sincerely,   True Mar, MD, PhD

## 2023-12-15 NOTE — Patient Instructions (Signed)

## 2023-12-16 ENCOUNTER — Encounter: Payer: Self-pay | Admitting: Family

## 2023-12-16 DIAGNOSIS — F913 Oppositional defiant disorder: Secondary | ICD-10-CM | POA: Diagnosis not present

## 2023-12-19 ENCOUNTER — Ambulatory Visit (INDEPENDENT_AMBULATORY_CARE_PROVIDER_SITE_OTHER): Admitting: Plastic Surgery

## 2023-12-19 VITALS — BP 106/70 | HR 59 | Ht 61.0 in | Wt 154.0 lb

## 2023-12-19 DIAGNOSIS — Z6829 Body mass index (BMI) 29.0-29.9, adult: Secondary | ICD-10-CM | POA: Diagnosis not present

## 2023-12-19 DIAGNOSIS — Z803 Family history of malignant neoplasm of breast: Secondary | ICD-10-CM | POA: Diagnosis not present

## 2023-12-19 DIAGNOSIS — G8929 Other chronic pain: Secondary | ICD-10-CM

## 2023-12-19 DIAGNOSIS — M542 Cervicalgia: Secondary | ICD-10-CM | POA: Diagnosis not present

## 2023-12-19 DIAGNOSIS — M546 Pain in thoracic spine: Secondary | ICD-10-CM | POA: Diagnosis not present

## 2023-12-19 DIAGNOSIS — N62 Hypertrophy of breast: Secondary | ICD-10-CM | POA: Diagnosis not present

## 2023-12-19 NOTE — Progress Notes (Signed)
 Patient ID: Deanna Reilly, female    DOB: Jun 25, 2000, 22 y.o.   MRN: [id number]   Chief Complaint  Patient presents with   Advice Only    Mammary Hyperplasia: The patient is a 23 y.o. female with a history of mammary hyperplasia for several years.  She has extremely large breasts causing symptoms that include the following: Back pain in the upper and lower back, including neck pain. She pulls or pins her bra straps to provide better lift and relief of the pressure and pain. She notices relief by holding her breast up manually.  Her shoulder straps cause grooves and pain and pressure that requires padding for relief. Pain medication is sometimes required with motrin  and tylenol.  Activities that are hindered by enlarged breasts include: exercise and running.  She has tried supportive clothing as well as fitted bras without improvement.  Her breasts are extremely large and fairly symmetric.  She has hyperpigmentation of the inframammary area on both sides.  The sternal to nipple distance on the right is 31 cm and the left is 32 cm.  The IMF distance is 14 cm.  She is 5 feet 1 inches tall and weighs 154 pounds.  The BMI = 29.1 kg/m.  Preoperative bra size = DD cup.  The estimated excess breast tissue to be removed at the time of surgery = 350-400 grams on the left and 350-400 grams on the right.  Mammogram history: none.  Family history of breast cancer:  paternal great aunt.  Tobacco use:  none.   The patient expresses the desire to pursue surgical intervention.  She has had wisdom teeth removed in the past with no complications.      Review of Systems  Constitutional:  Positive for activity change. Negative for appetite change.  Eyes: Negative.   Respiratory: Negative.    Cardiovascular: Negative.   Gastrointestinal: Negative.   Endocrine: Negative.   Genitourinary: Negative.   Musculoskeletal:  Positive for back pain and neck pain.  Skin:  Positive for rash.    Past Medical  History:  Diagnosis Date   ADHD    Asthma     No past surgical history on file.    Current Outpatient Medications:    albuterol  (VENTOLIN  HFA) 108 (90 Base) MCG/ACT inhaler, Inhale 2 puffs into the lungs every 6 (six) hours as needed for wheezing or shortness of breath., Disp: 8 g, Rfl: 2   escitalopram  (LEXAPRO ) 10 MG tablet, Take 1/2 tablet (5 mg) by mouth for one week, then increase to one tablet (10 mg) daily., Disp: 30 tablet, Rfl: 0   methylphenidate  (RITALIN ) 5 MG tablet, Take 1 tablet (5 mg total) by mouth 2 (two) times daily., Disp: 60 tablet, Rfl: 0   norethindrone  (AYGESTIN ) 5 MG tablet, Take 1 tablet (5 mg total) by mouth daily. Take as needed for breakthrough bleeding., Disp: 30 tablet, Rfl: 1   polyethylene glycol powder (GLYCOLAX /MIRALAX ) 17 GM/SCOOP powder, Take 1-2 capfuls daily for 1 soft bowel movement daily, Disp: 578 g, Rfl: 6   Objective:   Vitals:   12/19/23 0951  BP: 106/70  Pulse: (!) 59  SpO2: 100%    Physical Exam Vitals reviewed.  Constitutional:      Appearance: Normal appearance.  HENT:     Head: Atraumatic.  Cardiovascular:     Rate and Rhythm: Normal rate.     Pulses: Normal pulses.  Pulmonary:     Effort: Pulmonary effort is normal.  Abdominal:  Palpations: Abdomen is soft.  Musculoskeletal:        General: No swelling.  Skin:    General: Skin is warm.     Capillary Refill: Capillary refill takes less than 2 seconds.  Neurological:     Mental Status: She is oriented to person, place, and time.  Psychiatric:        Mood and Affect: Mood normal.        Behavior: Behavior normal.        Thought Content: Thought content normal.        Judgment: Judgment normal.     Assessment & Plan:  Symptomatic mammary hypertrophy  Chronic midline thoracic back pain  Neck pain  The procedure the patient selected and that was best for the patient was discussed. The risk were discussed and include but not limited to the following:  Breast  asymmetry, fluid accumulation, firmness of the breast, inability to breast feed, loss of nipple or areola, skin loss, change in skin and nipple sensation, fat necrosis of the breast tissue, bleeding, infection and healing delay.  There are risks of anesthesia and injury to nerves or blood vessels.  Allergic reaction to tape, suture and skin glue are possible.  There will be swelling.  Any of these can lead to the need for revisional surgery which is not included in this surgery.  A breast reduction has potential to interfere with diagnostic procedures in the future.  This procedure is best done when the breast is fully developed.  Changes in the breast will continue to occur over time: pregnancy, weight gain or weigh loss. No guarantees are given for a certain bra or breast size.    Total time: 40 minutes. This includes time spent with the patient during the visit as well as time spent before and after the visit reviewing the chart, documenting the encounter, ordering pertinent studies and literature for the patient.   Physical therapy: Not required Mammogram: Not required  The patient is a candidate for bilateral breast reduction with liposuction.  We discussed the risks and complications.  She would like to proceed with insurance preauthorization.  Pictures were obtained of the patient and placed in the chart with the patient's or guardian's permission.   Estefana RAMAN Carder Yin, DO

## 2023-12-20 ENCOUNTER — Other Ambulatory Visit: Payer: Self-pay | Admitting: Family

## 2023-12-20 DIAGNOSIS — E559 Vitamin D deficiency, unspecified: Secondary | ICD-10-CM

## 2023-12-20 DIAGNOSIS — R7309 Other abnormal glucose: Secondary | ICD-10-CM

## 2023-12-20 DIAGNOSIS — Z975 Presence of (intrauterine) contraceptive device: Secondary | ICD-10-CM

## 2023-12-22 ENCOUNTER — Telehealth: Payer: Self-pay | Admitting: Neurology

## 2023-12-22 NOTE — Telephone Encounter (Signed)
 [employer] pending

## 2023-12-22 NOTE — Telephone Encounter (Signed)
 [employer] no auth req via fax

## 2023-12-26 ENCOUNTER — Other Ambulatory Visit

## 2023-12-26 DIAGNOSIS — Z975 Presence of (intrauterine) contraceptive device: Secondary | ICD-10-CM | POA: Diagnosis not present

## 2023-12-26 DIAGNOSIS — N921 Excessive and frequent menstruation with irregular cycle: Secondary | ICD-10-CM | POA: Diagnosis not present

## 2023-12-26 DIAGNOSIS — R7309 Other abnormal glucose: Secondary | ICD-10-CM | POA: Diagnosis not present

## 2023-12-26 DIAGNOSIS — E559 Vitamin D deficiency, unspecified: Secondary | ICD-10-CM | POA: Diagnosis not present

## 2023-12-27 ENCOUNTER — Other Ambulatory Visit: Payer: Self-pay | Admitting: Family

## 2023-12-27 ENCOUNTER — Ambulatory Visit: Payer: Self-pay | Admitting: Family

## 2023-12-27 LAB — CBC WITH DIFFERENTIAL/PLATELET
Absolute Lymphocytes: 2895 {cells}/uL (ref 850–3900)
Absolute Monocytes: 616 {cells}/uL (ref 200–950)
Basophils Absolute: 39 {cells}/uL (ref 0–200)
Basophils Relative: 0.5 %
Eosinophils Absolute: 92 {cells}/uL (ref 15–500)
Eosinophils Relative: 1.2 %
HCT: 38.3 % (ref 35.0–45.0)
Hemoglobin: 12.3 g/dL (ref 11.7–15.5)
MCH: 27.2 pg (ref 27.0–33.0)
MCHC: 32.1 g/dL (ref 32.0–36.0)
MCV: 84.7 fL (ref 80.0–100.0)
MPV: 11.1 fL (ref 7.5–12.5)
Monocytes Relative: 8 %
Neutro Abs: 4058 {cells}/uL (ref 1500–7800)
Neutrophils Relative %: 52.7 %
Platelets: 286 Thousand/uL (ref 140–400)
RBC: 4.52 Million/uL (ref 3.80–5.10)
RDW: 13.1 % (ref 11.0–15.0)
Total Lymphocyte: 37.6 %
WBC: 7.7 Thousand/uL (ref 3.8–10.8)

## 2023-12-27 LAB — COMPREHENSIVE METABOLIC PANEL WITH GFR
AG Ratio: 1.4 (calc) (ref 1.0–2.5)
ALT: 12 U/L (ref 6–29)
AST: 14 U/L (ref 10–30)
Albumin: 4.4 g/dL (ref 3.6–5.1)
Alkaline phosphatase (APISO): 100 U/L (ref 31–125)
BUN: 11 mg/dL (ref 7–25)
CO2: 27 mmol/L (ref 20–32)
Calcium: 9.4 mg/dL (ref 8.6–10.2)
Chloride: 105 mmol/L (ref 98–110)
Creat: 0.69 mg/dL (ref 0.50–0.96)
Globulin: 3.1 g/dL (ref 1.9–3.7)
Glucose, Bld: 97 mg/dL (ref 65–99)
Potassium: 4.1 mmol/L (ref 3.5–5.3)
Sodium: 139 mmol/L (ref 135–146)
Total Bilirubin: 0.2 mg/dL (ref 0.2–1.2)
Total Protein: 7.5 g/dL (ref 6.1–8.1)
eGFR: 127 mL/min/1.73m2 (ref >=60)

## 2023-12-27 LAB — LIPID PANEL
Cholesterol: 146 mg/dL (ref <200)
HDL: 45 mg/dL — ABNORMAL LOW (ref >=50)
LDL Cholesterol (Calc): 83 mg/dL
Non-HDL Cholesterol (Calc): 101 mg/dL (ref <130)
Total CHOL/HDL Ratio: 3.2 (calc) (ref <5.0)
Triglycerides: 89 mg/dL (ref <150)

## 2023-12-27 LAB — HEMOGLOBIN A1C
Hgb A1c MFr Bld: 5.9 % — ABNORMAL HIGH (ref <5.7)
Mean Plasma Glucose: 123 mg/dL
eAG (mmol/L): 6.8 mmol/L

## 2023-12-27 LAB — VITAMIN D 25 HYDROXY (VIT D DEFICIENCY, FRACTURES): Vit D, 25-Hydroxy: 26 ng/mL — ABNORMAL LOW (ref 30–100)

## 2023-12-27 MED ORDER — VITAMIN D (ERGOCALCIFEROL) 1.25 MG (50000 UNIT) PO CAPS: 50000.0000 [IU] | ORAL_CAPSULE | ORAL | 0 refills | Status: DC

## 2024-01-06 ENCOUNTER — Encounter: Payer: Self-pay | Admitting: Family

## 2024-01-10 ENCOUNTER — Encounter: Payer: Self-pay | Admitting: Neurology

## 2024-01-11 DIAGNOSIS — F913 Oppositional defiant disorder: Secondary | ICD-10-CM | POA: Diagnosis not present

## 2024-01-12 ENCOUNTER — Ambulatory Visit: Admitting: Neurology

## 2024-01-12 DIAGNOSIS — R519 Headache, unspecified: Secondary | ICD-10-CM

## 2024-01-12 DIAGNOSIS — G478 Other sleep disorders: Secondary | ICD-10-CM

## 2024-01-12 DIAGNOSIS — G4720 Circadian rhythm sleep disorder, unspecified type: Secondary | ICD-10-CM

## 2024-01-12 DIAGNOSIS — G4719 Other hypersomnia: Secondary | ICD-10-CM

## 2024-01-12 DIAGNOSIS — R6889 Other general symptoms and signs: Secondary | ICD-10-CM

## 2024-01-12 DIAGNOSIS — Z9189 Other specified personal risk factors, not elsewhere classified: Secondary | ICD-10-CM

## 2024-01-15 ENCOUNTER — Encounter: Payer: Self-pay | Admitting: Family

## 2024-01-20 ENCOUNTER — Ambulatory Visit: Payer: Self-pay | Admitting: Neurology

## 2024-01-20 NOTE — Procedures (Signed)
 Physician Interpretation: Please see link under Procedure Tab or under Encounters tab for physician report, technical report, as well as O2 titration and/or PAP titration tables (if applicable).

## 2024-01-24 ENCOUNTER — Telehealth: Admitting: Family

## 2024-01-24 ENCOUNTER — Encounter: Payer: Self-pay | Admitting: Family

## 2024-01-24 DIAGNOSIS — Z3046 Encounter for surveillance of implantable subdermal contraceptive: Secondary | ICD-10-CM

## 2024-01-24 DIAGNOSIS — F411 Generalized anxiety disorder: Secondary | ICD-10-CM

## 2024-01-24 DIAGNOSIS — N921 Excessive and frequent menstruation with irregular cycle: Secondary | ICD-10-CM | POA: Diagnosis not present

## 2024-01-24 DIAGNOSIS — Z793 Long term (current) use of hormonal contraceptives: Secondary | ICD-10-CM

## 2024-01-24 NOTE — Progress Notes (Signed)
 THIS RECORD MAY CONTAIN CONFIDENTIAL INFORMATION THAT SHOULD NOT BE RELEASED WITHOUT REVIEW OF THE SERVICE PROVIDER.  Virtual Follow-Up Visit via Video Note  I connected with Deanna Reilly  on 01/24/24 at 11:00 AM EDT by a video enabled telemedicine application and verified that I am speaking with the correct person using two identifiers.   Patient/parent location: home Provider location: [facility] office   I discussed the limitations of evaluation and management by telemedicine and the availability of in person appointments.  I discussed that the purpose of this telehealth visit is to provide medical care while limiting exposure to the novel coronavirus.  The patient expressed understanding and agreed to proceed.   Deanna Reilly is a 23 y.o. female referred by Joshua Bari HERO, NP here today for follow-up of breakthrough bleeding with Nexplanon .   History was provided by the patient.  Supervising Physician: Dr. Kreg Helena   Plan from Last Visit 11/11/23   1. Generalized anxiety disorder (Primary) -last dose of Lexapro  was last Saturday -advised to continue acknowledging the negative thoughts and challenging them when they come; she may benefit from restarting medication pending school start if anxiety symptoms worsen -return precautions reviewed  Chief Complaint: Breakthrough bleeding with implant  History of Present Illness:  -having breakthrough bleeding with implant (removal and reinsertion on 03/21/2023) -sometimes will be heavy; understands it is side effects, trying to decide if she wants to keep the implant or do something different  -has breast reduction surgery scheduled for 03/28/24; endorses anxiety about surgery but also excited for results; still having intermittent breast pain -had episode of chest pain and went to see student health; was reassured it was OK; no pain since that time; felt SOB x 3 days after the incident of feeling like her heart stopped beating  then returned to normal (normal ECG 01/22/2021) -re: her anxiety, she feels she has it under control; will hang out with friends, talk; does not like to focus on her health; does not like to think about her heart beating normally, otherwise it makes her feel weird; but when something is off she is aware of it      No Known Allergies Outpatient Medications Prior to Visit  Medication Sig Dispense Refill   albuterol  (VENTOLIN  HFA) 108 (90 Base) MCG/ACT inhaler Inhale 2 puffs into the lungs every 6 (six) hours as needed for wheezing or shortness of breath. 8 g 2   escitalopram  (LEXAPRO ) 10 MG tablet Take 1/2 tablet (5 mg) by mouth for one week, then increase to one tablet (10 mg) daily. 30 tablet 0   methylphenidate  (RITALIN ) 5 MG tablet Take 1 tablet (5 mg total) by mouth 2 (two) times daily. 60 tablet 0   norethindrone  (AYGESTIN ) 5 MG tablet Take 1 tablet (5 mg total) by mouth daily. Take as needed for breakthrough bleeding. 30 tablet 1   polyethylene glycol powder (GLYCOLAX /MIRALAX ) 17 GM/SCOOP powder Take 1-2 capfuls daily for 1 soft bowel movement daily 578 g 6   Vitamin D , Ergocalciferol , (DRISDOL ) 1.25 MG (50000 UNIT) CAPS capsule Take 1 capsule (50,000 Units total) by mouth every 7 (seven) days. 8 capsule 0   No facility-administered medications prior to visit.     Patient Active Problem List   Diagnosis Date Noted   Symptomatic mammary hypertrophy 12/19/2023   Neck pain 12/19/2023   Constipation 04/12/2020   Chronic midline thoracic back pain 11/06/2019   Absolute anemia 12/13/2017   ADHD (attention deficit hyperactivity disorder), combined type 09/20/2012  Generalized anxiety disorder 09/20/2012   Sleep disturbance 09/20/2012   The following portions of the patient's history were reviewed and updated as appropriate: allergies, current medications, past family history, past medical history, past social history, past surgical history, and problem list.  Visual  Observations/Objective:   General Appearance: Well nourished well developed, in no apparent distress.  Eyes: conjunctiva no swelling or erythema ENT/Mouth: No hoarseness, No cough for duration of visit.  Neck: Supple  Respiratory: Respiratory effort normal, normal rate, no retractions or distress.   Cardio: Appears well-perfused, noncyanotic Musculoskeletal: no obvious deformity Skin: visible skin without rashes, ecchymosis, erythema Neuro: Awake and oriented X 3,  Psych:  normal affect, Insight and Judgment appropriate.    Assessment/Plan: 1. Breakthrough bleeding on Nexplanon  (Primary) -discussed that breakthrough bleeding is the most common side effect of implant use; she is pre-contemplative for change; may consider removal but likely not before surgery; we discussed aging out of our practice and she will look into OB/GYN practices locally and advise if she needs a referral   2. Generalized anxiety disorder -discussed anxiety and somatic symptoms; reviewed return precautions and indications for further evaluation including chest pain, SOB, worsening anxiety or panic symptoms; advised that My Chart is indicated for non-medical emergency communication only and to ensure she is getting proper care when needed; she confirmed understanding.   I discussed the assessment and treatment plan with the patient and/or parent/guardian.  They were provided an opportunity to ask questions and all were answered.  They agreed with the plan and demonstrated an understanding of the instructions. They were advised to call back or seek an in-person evaluation in the emergency room if the symptoms worsen or if the condition fails to improve as anticipated.   Follow-up:   as needed   Bari CHRISTELLA Molt, NP    CC: Molt Bari CHRISTELLA, NP, Molt Bari CHRISTELLA, NP

## 2024-01-25 ENCOUNTER — Encounter

## 2024-01-26 ENCOUNTER — Encounter

## 2024-02-01 NOTE — Telephone Encounter (Signed)
 I called the patient and discussed her sleep study. Patient was asked to follow-up with PCP regarding the vitamin d , thyroid  check, and vit B12. Pt verbalized understanding. Message forwarded to PCP. Pt was also scheduled for 6 mo f/u with Dr Buck on 07/16/24 at 1:15 pm. She thanked me for the call.

## 2024-02-01 NOTE — Telephone Encounter (Signed)
-----   Message from True Mar sent at 01/20/2024 12:56 PM EDT ----- Sleep study did not show any significant abnormality.  I recommend patient get treated for low vit D and get checked for improvement of vit D levels and get checked for thyroid  dysfunction and low vit B12 by PCP. We can FU in about 6 mon and may consider  additional sleep testing with a nighttime study and next day nap study if sleepiness is ongoing/persistent. Pls remind patient not to drive when feeling sleep. Offer FU appt in 6 mon.  sa  ----- Message ----- From: Mar True, MD Sent: 01/20/2024  12:52 PM EDT To: True Mar, MD

## 2024-02-03 ENCOUNTER — Institutional Professional Consult (permissible substitution): Admitting: Plastic Surgery

## 2024-02-09 ENCOUNTER — Encounter

## 2024-02-16 DIAGNOSIS — F913 Oppositional defiant disorder: Secondary | ICD-10-CM | POA: Diagnosis not present

## 2024-02-22 ENCOUNTER — Encounter: Payer: Self-pay | Admitting: Family

## 2024-03-01 ENCOUNTER — Ambulatory Visit: Admitting: Family

## 2024-03-01 ENCOUNTER — Encounter: Payer: Self-pay | Admitting: Family

## 2024-03-01 VITALS — BP 111/72 | HR 69 | Ht 61.12 in | Wt 147.6 lb

## 2024-03-01 DIAGNOSIS — D508 Other iron deficiency anemias: Secondary | ICD-10-CM | POA: Diagnosis not present

## 2024-03-01 DIAGNOSIS — K5909 Other constipation: Secondary | ICD-10-CM

## 2024-03-01 DIAGNOSIS — E559 Vitamin D deficiency, unspecified: Secondary | ICD-10-CM | POA: Diagnosis not present

## 2024-03-01 DIAGNOSIS — R6889 Other general symptoms and signs: Secondary | ICD-10-CM | POA: Diagnosis not present

## 2024-03-01 DIAGNOSIS — R634 Abnormal weight loss: Secondary | ICD-10-CM
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
## 2024-03-01 NOTE — Patient Instructions (Addendum)
 Check out [facility] location and let me know what you think.    Continue to work on improving your bowel habits.  Drink plenty of water and increase fiber-rich foods.  Use Miralax  daily 1-2 capfuls in 8-10 ounces of water.

## 2024-03-02 ENCOUNTER — Ambulatory Visit: Payer: Self-pay | Admitting: Family

## 2024-03-02 LAB — COMPREHENSIVE METABOLIC PANEL WITH GFR
AG Ratio: 1.6 (calc) (ref 1.0–2.5)
ALT: 8 U/L (ref 6–29)
AST: 11 U/L (ref 10–30)
Albumin: 4.5 g/dL (ref 3.6–5.1)
Alkaline phosphatase (APISO): 93 U/L (ref 31–125)
BUN: 11 mg/dL (ref 7–25)
CO2: 26 mmol/L (ref 20–32)
Calcium: 9.5 mg/dL (ref 8.6–10.2)
Chloride: 105 mmol/L (ref 98–110)
Creat: 0.73 mg/dL (ref 0.50–0.96)
Globulin: 2.8 g/dL (ref 1.9–3.7)
Glucose, Bld: 89 mg/dL (ref 65–139)
Potassium: 4.1 mmol/L (ref 3.5–5.3)
Sodium: 139 mmol/L (ref 135–146)
Total Bilirubin: 0.3 mg/dL (ref 0.2–1.2)
Total Protein: 7.3 g/dL (ref 6.1–8.1)
eGFR: 120 mL/min/1.73m2 (ref >=60)

## 2024-03-02 LAB — VITAMIN D 25 HYDROXY (VIT D DEFICIENCY, FRACTURES): Vit D, 25-Hydroxy: 68 ng/mL (ref 30–100)

## 2024-03-02 LAB — CBC WITH DIFFERENTIAL/PLATELET
Absolute Lymphocytes: 2265 {cells}/uL (ref 850–3900)
Absolute Monocytes: 402 {cells}/uL (ref 200–950)
Basophils Absolute: 27 {cells}/uL (ref 0–200)
Basophils Relative: 0.4 %
Eosinophils Absolute: 47 {cells}/uL (ref 15–500)
Eosinophils Relative: 0.7 %
HCT: 36.4 % (ref 35.0–45.0)
Hemoglobin: 11.8 g/dL (ref 11.7–15.5)
MCH: 27.2 pg (ref 27.0–33.0)
MCHC: 32.4 g/dL (ref 32.0–36.0)
MCV: 83.9 fL (ref 80.0–100.0)
MPV: 10.9 fL (ref 7.5–12.5)
Monocytes Relative: 6 %
Neutro Abs: 3960 {cells}/uL (ref 1500–7800)
Neutrophils Relative %: 59.1 %
Platelets: 254 Thousand/uL (ref 140–400)
RBC: 4.34 Million/uL (ref 3.80–5.10)
RDW: 12.7 % (ref 11.0–15.0)
Total Lymphocyte: 33.8 %
WBC: 6.7 Thousand/uL (ref 3.8–10.8)

## 2024-03-02 LAB — THYROID PANEL WITH TSH
Free Thyroxine Index: 2.9 (ref 1.4–3.8)
T3 Uptake: 29 % (ref 22–35)
T4, Total: 10.1 ug/dL (ref 5.1–11.9)
TSH: 0.66 m[IU]/L

## 2024-03-02 LAB — B12 AND FOLATE PANEL
Folate: 9.1 ng/mL
Vitamin B-12: 288 pg/mL (ref 200–1100)

## 2024-03-16 ENCOUNTER — Ambulatory Visit: Admitting: Student

## 2024-03-16 VITALS — BP 122/77 | HR 79 | Ht 61.5 in | Wt 147.0 lb

## 2024-03-16 DIAGNOSIS — N62 Hypertrophy of breast: Secondary | ICD-10-CM | POA: Diagnosis not present

## 2024-03-16 DIAGNOSIS — Z803 Family history of malignant neoplasm of breast: Secondary | ICD-10-CM | POA: Diagnosis not present

## 2024-03-16 MED ORDER — ONDANSETRON HCL 4 MG PO TABS: 4.0000 mg | ORAL_TABLET | Freq: Three times a day (TID) | ORAL | 0 refills | Status: AC | PRN

## 2024-03-16 MED ORDER — OXYCODONE HCL 5 MG PO TABS: 5.0000 mg | ORAL_TABLET | Freq: Four times a day (QID) | ORAL | 0 refills | Status: AC | PRN

## 2024-03-16 MED ORDER — CEPHALEXIN 500 MG PO CAPS: 500.0000 mg | ORAL_CAPSULE | Freq: Four times a day (QID) | ORAL | 0 refills | Status: AC

## 2024-03-16 NOTE — H&P (View-Only) (Signed)
 Patient ID: Deanna Reilly, female    DOB: 03-05-2001, 23 y.o.   MRN: [id number]  Chief Complaint  Patient presents with   Pre-op Exam      ICD-10-CM   1. Symptomatic mammary hypertrophy  N62        History of Present Illness: Deanna Reilly is a 23 y.o.  female  with a history of macromastia.  She presents for preoperative evaluation for upcoming procedure, Bilateral Breast Reduction with lipo suction, scheduled for 03/28/2024 with Dr.  Lowery  The patient presents with her mother at bedside.  The patient has not had problems with anesthesia.  She states that she has never needed a mammogram before.  She does report that her great aunt has had breast cancer.  Patient does report that she has had heart palpitations due to anxiety in the past.  She states that she does not see a cardiologist for this.  She states that is never been picked up on an EKG or Holter monitor.  She denies taking any blood thinners.  Patient reports she is not a smoker.  Patient reports she currently has a Nexplanon  in in place for birth control.  She denies any history of miscarriages.  She denies any personal history of blood clots.  She does report her grandmother had a DVT.  She does not report any personal family history of clotting diseases.  She denies any recent surgeries, traumas or infections.  She denies any history of stroke or heart attack.  She denies any history of Crohn's disease or ulcerative colitis.  She she reports that she has mild asthma which is controlled.  She denies any history of cancer.  She denies any varicosities to her lower extremities.  She denies any recent fevers, chills or changes in her health.  Patient reports she is a DDD cup.  She states that she would like to be a B/C cup.  Discussed with the patient that cup size cannot be guaranteed.  She expressed understanding.  Summary of Previous Visit: Patient was seen for initial consult by Dr. Lowery on 12/19/2023.   At this visit, patient complained of upper back and neck pain due to her enlarged breast.  On exam, her STN on the right was 31 cm and her STN on the left was 32 cm.  Her BMI was 29.1 kg/m.  Her preoperative bra size was a DD cup.  The estimated amount of excess breast tissue to be removed at the time of surgery was 350-400 g bilaterally.  The patient was found to be a candidate for bilateral breast reduction with lipo suction.  Estimated excess breast tissue to be removed at time of surgery: 350-400 grams  Job: Currently in school/works at school, she states that she will be on Christmas break at the time of surgery and until January.  PMH Significant for: GAD, ADHD, macromastia   Past Medical History: Allergies: No Known Allergies  Current Medications:  Current Outpatient Medications:    cephALEXin  (KEFLEX ) 500 MG capsule, Take 1 capsule (500 mg total) by mouth 4 (four) times daily for 3 days., Disp: 12 capsule, Rfl: 0   ondansetron  (ZOFRAN ) 4 MG tablet, Take 1 tablet (4 mg total) by mouth every 8 (eight) hours as needed for up to 15 doses for nausea or vomiting., Disp: 15 tablet, Rfl: 0   oxyCODONE  (ROXICODONE ) 5 MG immediate release tablet, Take 1 tablet (5 mg total) by mouth every 6 (six) hours as needed  for up to 15 doses for severe pain (pain score 7-10)., Disp: 15 tablet, Rfl: 0   albuterol  (VENTOLIN  HFA) 108 (90 Base) MCG/ACT inhaler, Inhale 2 puffs into the lungs every 6 (six) hours as needed for wheezing or shortness of breath., Disp: 8 g, Rfl: 2   escitalopram  (LEXAPRO ) 10 MG tablet, Take 1/2 tablet (5 mg) by mouth for one week, then increase to one tablet (10 mg) daily. (Patient not taking: Reported on 03/01/2024), Disp: 30 tablet, Rfl: 0   methylphenidate  (RITALIN ) 5 MG tablet, Take 1 tablet (5 mg total) by mouth 2 (two) times daily. (Patient not taking: Reported on 03/01/2024), Disp: 60 tablet, Rfl: 0   norethindrone  (AYGESTIN ) 5 MG tablet, Take 1 tablet (5 mg total) by mouth  daily. Take as needed for breakthrough bleeding. (Patient not taking: Reported on 03/01/2024), Disp: 30 tablet, Rfl: 1   polyethylene glycol powder (GLYCOLAX /MIRALAX ) 17 GM/SCOOP powder, Take 1-2 capfuls daily for 1 soft bowel movement daily, Disp: 578 g, Rfl: 6  Past Medical Problems: Past Medical History:  Diagnosis Date   ADHD    Asthma     Past Surgical History: No past surgical history on file.  Social History: Social History   Socioeconomic History   Marital status: Single    Spouse name: Not on file   Number of children: Not on file   Years of education: Not on file   Highest education level: Not on file  Occupational History   Not on file  Tobacco Use   Smoking status: Never    Passive exposure: Never   Smokeless tobacco: Never   Tobacco comments:    only rarely when dad visits. no smoke exp on daily basis.  Substance and Sexual Activity   Alcohol use: No    Alcohol/week: 0.0 standard drinks of alcohol   Drug use: No   Sexual activity: Never  Other Topics Concern   Not on file  Social History Narrative   ** Merged History Encounter **       Social Drivers of Health   Financial Resource Strain: Not on file  Food Insecurity: No Food Insecurity (04/17/2020)   Hunger Vital Sign    Worried About Running Out of Food in the Last Year: Never true    Ran Out of Food in the Last Year: Never true  Transportation Needs: Unmet Transportation Needs (12/15/2022)   PRAPARE - Administrator, Civil Service (Medical): Yes    Lack of Transportation (Non-Medical): Yes  Physical Activity: Not on file  Stress: Not on file  Social Connections: Not on file  Intimate Partner Violence: Not on file    Family History: Family History  Adopted: Yes    Review of Systems: Denies any recent fevers, chills or changes in her health  Physical Exam: Vital Signs BP 122/77   Pulse 79   Ht 5' 1.5 (1.562 m)   Wt 147 lb (66.7 kg)   SpO2 98%   BMI 27.33 kg/m   Physical  Exam  Constitutional:      General: Not in acute distress.    Appearance: Normal appearance. Not ill-appearing.  HENT:     Head: Normocephalic and atraumatic.  Eyes:     Pupils: Pupils are equal, round Neck:     Musculoskeletal: Normal range of motion.  Cardiovascular:     Rate and Rhythm: Normal rate Pulmonary:     Effort: Pulmonary effort is normal. No respiratory distress.  Musculoskeletal: Normal range of motion.  Skin:    General: Skin is warm and dry.     Findings: No erythema or rash.  Neurological:     Mental Status: Alert and oriented to person, place, and time. Mental status is at baseline.  Psychiatric:        Mood and Affect: Mood normal.        Behavior: Behavior normal.    Assessment/Plan: The patient is scheduled for bilateral breast reduction with Dr. Lowery.  Risks, benefits, and alternatives of procedure discussed, questions answered and consent obtained.    Will send clearance to patient's PCP.  Clearance form was given to clinical staff to fax off.  Smoking Status: Non-smoker; Counseling Given?  N/A Last Mammogram: N/A due to age  Caprini Score: 6; Risk Factors include: Family history of thrombosis, BMI greater than 25, and length of planned surgery. Recommendation for mechanical prophylaxis. Encourage early ambulation.   Pictures obtained: @consult   Post-op Rx sent to pharmacy: Oxycodone , Zofran , Keflex   Instructed patient to hold her methylphenidate  the day of surgery and any sort of multivitamins or supplements at least 1 week prior to surgery.  Also discussed with her to hold ibuprofen  1 week prior to surgery.  Patient expressed understanding.  Patient was provided with the breast reduction and General Surgical Risk consent document and Pain Medication Agreement prior to their appointment.  They had adequate time to read through the risk consent documents and Pain Medication Agreement. We also discussed them in person together during this preop  appointment. All of their questions were answered to their satisfaction.  Recommended calling if they have any further questions.  Risk consent form and Pain Medication Agreement to be scanned into patient's chart.  The risk that can be encountered with breast reduction were discussed and include the following but not limited to these:  Breast asymmetry, fluid accumulation, firmness of the breast, inability to breast feed, loss of nipple or areola, skin loss, decrease or no nipple sensation, fat necrosis of the breast tissue, bleeding, infection, healing delay.  There are risks of anesthesia, changes to skin sensation and injury to nerves or blood vessels.  The muscle can be temporarily or permanently injured.  You may have an allergic reaction to tape, suture, glue, blood products which can result in skin discoloration, swelling, pain, skin lesions, poor healing.  Any of these can lead to the need for revisonal surgery or stage procedures.  A reduction has potential to interfere with diagnostic procedures.  Nipple or breast piercing can increase risks of infection.  This procedure is best done when the breast is fully developed.  Changes in the breast will continue to occur over time.  Pregnancy can alter the outcomes of previous breast reduction surgery, weight gain and weigh loss can also effect the long term appearance.     Electronically signed by: Estefana FORBES Peck, PA-C 03/16/2024 4:35 PM

## 2024-03-16 NOTE — Progress Notes (Signed)
 Patient ID: Deanna Reilly, female    DOB: 03-05-2001, 23 y.o.   MRN: [id number]  Chief Complaint  Patient presents with   Pre-op Exam      ICD-10-CM   1. Symptomatic mammary hypertrophy  N62        History of Present Illness: Deanna Reilly is a 23 y.o.  female  with a history of macromastia.  She presents for preoperative evaluation for upcoming procedure, Bilateral Breast Reduction with lipo suction, scheduled for 03/28/2024 with Dr.  Lowery  The patient presents with her mother at bedside.  The patient has not had problems with anesthesia.  She states that she has never needed a mammogram before.  She does report that her great aunt has had breast cancer.  Patient does report that she has had heart palpitations due to anxiety in the past.  She states that she does not see a cardiologist for this.  She states that is never been picked up on an EKG or Holter monitor.  She denies taking any blood thinners.  Patient reports she is not a smoker.  Patient reports she currently has a Nexplanon  in in place for birth control.  She denies any history of miscarriages.  She denies any personal history of blood clots.  She does report her grandmother had a DVT.  She does not report any personal family history of clotting diseases.  She denies any recent surgeries, traumas or infections.  She denies any history of stroke or heart attack.  She denies any history of Crohn's disease or ulcerative colitis.  She she reports that she has mild asthma which is controlled.  She denies any history of cancer.  She denies any varicosities to her lower extremities.  She denies any recent fevers, chills or changes in her health.  Patient reports she is a DDD cup.  She states that she would like to be a B/C cup.  Discussed with the patient that cup size cannot be guaranteed.  She expressed understanding.  Summary of Previous Visit: Patient was seen for initial consult by Dr. Lowery on 12/19/2023.   At this visit, patient complained of upper back and neck pain due to her enlarged breast.  On exam, her STN on the right was 31 cm and her STN on the left was 32 cm.  Her BMI was 29.1 kg/m.  Her preoperative bra size was a DD cup.  The estimated amount of excess breast tissue to be removed at the time of surgery was 350-400 g bilaterally.  The patient was found to be a candidate for bilateral breast reduction with lipo suction.  Estimated excess breast tissue to be removed at time of surgery: 350-400 grams  Job: Currently in school/works at school, she states that she will be on Christmas break at the time of surgery and until January.  PMH Significant for: GAD, ADHD, macromastia   Past Medical History: Allergies: No Known Allergies  Current Medications:  Current Outpatient Medications:    cephALEXin  (KEFLEX ) 500 MG capsule, Take 1 capsule (500 mg total) by mouth 4 (four) times daily for 3 days., Disp: 12 capsule, Rfl: 0   ondansetron  (ZOFRAN ) 4 MG tablet, Take 1 tablet (4 mg total) by mouth every 8 (eight) hours as needed for up to 15 doses for nausea or vomiting., Disp: 15 tablet, Rfl: 0   oxyCODONE  (ROXICODONE ) 5 MG immediate release tablet, Take 1 tablet (5 mg total) by mouth every 6 (six) hours as needed  for up to 15 doses for severe pain (pain score 7-10)., Disp: 15 tablet, Rfl: 0   albuterol  (VENTOLIN  HFA) 108 (90 Base) MCG/ACT inhaler, Inhale 2 puffs into the lungs every 6 (six) hours as needed for wheezing or shortness of breath., Disp: 8 g, Rfl: 2   escitalopram  (LEXAPRO ) 10 MG tablet, Take 1/2 tablet (5 mg) by mouth for one week, then increase to one tablet (10 mg) daily. (Patient not taking: Reported on 03/01/2024), Disp: 30 tablet, Rfl: 0   methylphenidate  (RITALIN ) 5 MG tablet, Take 1 tablet (5 mg total) by mouth 2 (two) times daily. (Patient not taking: Reported on 03/01/2024), Disp: 60 tablet, Rfl: 0   norethindrone  (AYGESTIN ) 5 MG tablet, Take 1 tablet (5 mg total) by mouth  daily. Take as needed for breakthrough bleeding. (Patient not taking: Reported on 03/01/2024), Disp: 30 tablet, Rfl: 1   polyethylene glycol powder (GLYCOLAX /MIRALAX ) 17 GM/SCOOP powder, Take 1-2 capfuls daily for 1 soft bowel movement daily, Disp: 578 g, Rfl: 6  Past Medical Problems: Past Medical History:  Diagnosis Date   ADHD    Asthma     Past Surgical History: No past surgical history on file.  Social History: Social History   Socioeconomic History   Marital status: Single    Spouse name: Not on file   Number of children: Not on file   Years of education: Not on file   Highest education level: Not on file  Occupational History   Not on file  Tobacco Use   Smoking status: Never    Passive exposure: Never   Smokeless tobacco: Never   Tobacco comments:    only rarely when dad visits. no smoke exp on daily basis.  Substance and Sexual Activity   Alcohol use: No    Alcohol/week: 0.0 standard drinks of alcohol   Drug use: No   Sexual activity: Never  Other Topics Concern   Not on file  Social History Narrative   ** Merged History Encounter **       Social Drivers of Health   Financial Resource Strain: Not on file  Food Insecurity: No Food Insecurity (04/17/2020)   Hunger Vital Sign    Worried About Running Out of Food in the Last Year: Never true    Ran Out of Food in the Last Year: Never true  Transportation Needs: Unmet Transportation Needs (12/15/2022)   PRAPARE - Administrator, Civil Service (Medical): Yes    Lack of Transportation (Non-Medical): Yes  Physical Activity: Not on file  Stress: Not on file  Social Connections: Not on file  Intimate Partner Violence: Not on file    Family History: Family History  Adopted: Yes    Review of Systems: Denies any recent fevers, chills or changes in her health  Physical Exam: Vital Signs BP 122/77   Pulse 79   Ht 5' 1.5 (1.562 m)   Wt 147 lb (66.7 kg)   SpO2 98%   BMI 27.33 kg/m   Physical  Exam  Constitutional:      General: Not in acute distress.    Appearance: Normal appearance. Not ill-appearing.  HENT:     Head: Normocephalic and atraumatic.  Eyes:     Pupils: Pupils are equal, round Neck:     Musculoskeletal: Normal range of motion.  Cardiovascular:     Rate and Rhythm: Normal rate Pulmonary:     Effort: Pulmonary effort is normal. No respiratory distress.  Musculoskeletal: Normal range of motion.  Skin:    General: Skin is warm and dry.     Findings: No erythema or rash.  Neurological:     Mental Status: Alert and oriented to person, place, and time. Mental status is at baseline.  Psychiatric:        Mood and Affect: Mood normal.        Behavior: Behavior normal.    Assessment/Plan: The patient is scheduled for bilateral breast reduction with Dr. Lowery.  Risks, benefits, and alternatives of procedure discussed, questions answered and consent obtained.    Will send clearance to patient's PCP.  Clearance form was given to clinical staff to fax off.  Smoking Status: Non-smoker; Counseling Given?  N/A Last Mammogram: N/A due to age  Caprini Score: 6; Risk Factors include: Family history of thrombosis, BMI greater than 25, and length of planned surgery. Recommendation for mechanical prophylaxis. Encourage early ambulation.   Pictures obtained: @consult   Post-op Rx sent to pharmacy: Oxycodone , Zofran , Keflex   Instructed patient to hold her methylphenidate  the day of surgery and any sort of multivitamins or supplements at least 1 week prior to surgery.  Also discussed with her to hold ibuprofen  1 week prior to surgery.  Patient expressed understanding.  Patient was provided with the breast reduction and General Surgical Risk consent document and Pain Medication Agreement prior to their appointment.  They had adequate time to read through the risk consent documents and Pain Medication Agreement. We also discussed them in person together during this preop  appointment. All of their questions were answered to their satisfaction.  Recommended calling if they have any further questions.  Risk consent form and Pain Medication Agreement to be scanned into patient's chart.  The risk that can be encountered with breast reduction were discussed and include the following but not limited to these:  Breast asymmetry, fluid accumulation, firmness of the breast, inability to breast feed, loss of nipple or areola, skin loss, decrease or no nipple sensation, fat necrosis of the breast tissue, bleeding, infection, healing delay.  There are risks of anesthesia, changes to skin sensation and injury to nerves or blood vessels.  The muscle can be temporarily or permanently injured.  You may have an allergic reaction to tape, suture, glue, blood products which can result in skin discoloration, swelling, pain, skin lesions, poor healing.  Any of these can lead to the need for revisonal surgery or stage procedures.  A reduction has potential to interfere with diagnostic procedures.  Nipple or breast piercing can increase risks of infection.  This procedure is best done when the breast is fully developed.  Changes in the breast will continue to occur over time.  Pregnancy can alter the outcomes of previous breast reduction surgery, weight gain and weigh loss can also effect the long term appearance.     Electronically signed by: Estefana FORBES Peck, PA-C 03/16/2024 4:35 PM

## 2024-03-20 ENCOUNTER — Telehealth: Payer: Self-pay

## 2024-03-20 ENCOUNTER — Other Ambulatory Visit: Payer: Self-pay | Admitting: Family

## 2024-03-20 NOTE — Telephone Encounter (Signed)
   __x_ Surgical clearance Forms received via Mychart/nurse line printed off by RN __x_ Nurse portion completed __x_ Forms/notes placed on NP's desk for review and signature. ___ Forms completed by Provider and placed in completed Provider folder for office leadership pick up ___Forms completed by Provider and faxed to designated location, encounter closed

## 2024-03-20 NOTE — Progress Notes (Signed)
 Request for surgical clearance form from [facility], for breast reduction surgery on 03/28/2024.  Phone call to [name]; confirmed she is not taking Lexapro  (not taken since summer) nor Ritalin ; also confirmed she no longer needs albuterol  inhaler (2024 med); updated medication list.   Last EKG 01/22/2021 NSR   Cleared for surgery. Form completed.

## 2024-03-21 ENCOUNTER — Encounter (HOSPITAL_BASED_OUTPATIENT_CLINIC_OR_DEPARTMENT_OTHER): Payer: Self-pay | Admitting: Plastic Surgery

## 2024-03-28 ENCOUNTER — Ambulatory Visit (HOSPITAL_BASED_OUTPATIENT_CLINIC_OR_DEPARTMENT_OTHER): Admitting: Anesthesiology

## 2024-03-28 ENCOUNTER — Other Ambulatory Visit: Payer: Self-pay

## 2024-03-28 ENCOUNTER — Ambulatory Visit (HOSPITAL_BASED_OUTPATIENT_CLINIC_OR_DEPARTMENT_OTHER)
Admission: RE | Admit: 2024-03-28 | Discharge: 2024-03-28 | Disposition: A | Attending: Plastic Surgery | Admitting: Plastic Surgery

## 2024-03-28 ENCOUNTER — Encounter (HOSPITAL_BASED_OUTPATIENT_CLINIC_OR_DEPARTMENT_OTHER): Admission: RE | Disposition: A | Payer: Self-pay | Source: Home / Self Care | Attending: Plastic Surgery

## 2024-03-28 ENCOUNTER — Encounter (HOSPITAL_BASED_OUTPATIENT_CLINIC_OR_DEPARTMENT_OTHER): Payer: Self-pay | Admitting: Plastic Surgery

## 2024-03-28 DIAGNOSIS — M542 Cervicalgia: Secondary | ICD-10-CM | POA: Diagnosis not present

## 2024-03-28 DIAGNOSIS — N62 Hypertrophy of breast: Secondary | ICD-10-CM | POA: Diagnosis not present

## 2024-03-28 DIAGNOSIS — F419 Anxiety disorder, unspecified: Secondary | ICD-10-CM | POA: Diagnosis not present

## 2024-03-28 DIAGNOSIS — Z5982 Transportation insecurity: Secondary | ICD-10-CM | POA: Diagnosis not present

## 2024-03-28 DIAGNOSIS — M549 Dorsalgia, unspecified: Secondary | ICD-10-CM | POA: Insufficient documentation

## 2024-03-28 DIAGNOSIS — J45909 Unspecified asthma, uncomplicated: Secondary | ICD-10-CM | POA: Diagnosis not present

## 2024-03-28 DIAGNOSIS — Z803 Family history of malignant neoplasm of breast: Secondary | ICD-10-CM | POA: Diagnosis not present

## 2024-03-28 DIAGNOSIS — Z01818 Encounter for other preprocedural examination: Secondary | ICD-10-CM

## 2024-03-28 HISTORY — PX: BREAST REDUCTION SURGERY: SHX8

## 2024-03-28 LAB — POCT PREGNANCY, URINE: Preg Test, Ur: NEGATIVE

## 2024-03-28 SURGERY — BREAST REDUCTION WITH LIPOSUCTION
Anesthesia: General | Site: Breast | Laterality: Bilateral

## 2024-03-28 MED ORDER — OXYCODONE HCL 5 MG PO TABS: 5.0000 mg | ORAL_TABLET | ORAL | Status: DC | PRN

## 2024-03-28 MED ORDER — BUPIVACAINE HCL (PF) 0.25 % IJ SOLN
INTRAMUSCULAR | Status: AC
  Filled 2024-03-28: qty 30

## 2024-03-28 MED ORDER — PROPOFOL 10 MG/ML IV BOLUS
INTRAVENOUS | Status: DC | PRN
  Administered 2024-03-28: 09:00:00 150 mg via INTRAVENOUS

## 2024-03-28 MED ORDER — HYDROMORPHONE HCL 1 MG/ML IJ SOLN: 0.2500 mg | INTRAMUSCULAR | Status: DC | PRN

## 2024-03-28 MED ORDER — DEXMEDETOMIDINE HCL IN NACL 80 MCG/20ML IV SOLN
INTRAVENOUS | Status: DC | PRN
  Administered 2024-03-28 (×2): 12 ug via INTRAVENOUS

## 2024-03-28 MED ORDER — CHLORHEXIDINE GLUCONATE CLOTH 2 % EX PADS: 6.0000 | MEDICATED_PAD | Freq: Once | CUTANEOUS | Status: DC

## 2024-03-28 MED ORDER — PROPOFOL 10 MG/ML IV BOLUS
INTRAVENOUS | Status: AC
  Filled 2024-03-28: qty 20

## 2024-03-28 MED ORDER — ACETAMINOPHEN 10 MG/ML IV SOLN
INTRAVENOUS | Status: DC | PRN
  Administered 2024-03-28: 09:00:00 1000 mg via INTRAVENOUS

## 2024-03-28 MED ORDER — ONDANSETRON HCL 4 MG/2ML IJ SOLN
INTRAMUSCULAR | Status: AC
  Filled 2024-03-28: qty 2

## 2024-03-28 MED ORDER — ACETAMINOPHEN 325 MG PO TABS: 650.0000 mg | ORAL_TABLET | ORAL | Status: DC | PRN

## 2024-03-28 MED ORDER — FENTANYL CITRATE (PF) 100 MCG/2ML IJ SOLN
INTRAMUSCULAR | Status: DC | PRN
  Administered 2024-03-28 (×2): 50 ug via INTRAVENOUS

## 2024-03-28 MED ORDER — LACTATED RINGERS IV SOLN: INTRAVENOUS | Status: DC

## 2024-03-28 MED ORDER — MIDAZOLAM HCL 2 MG/2ML IJ SOLN
INTRAMUSCULAR | Status: AC
  Filled 2024-03-28: qty 2

## 2024-03-28 MED ORDER — MIDAZOLAM HCL 5 MG/5ML IJ SOLN
INTRAMUSCULAR | Status: DC | PRN
  Administered 2024-03-28: 08:00:00 2 mg via INTRAVENOUS

## 2024-03-28 MED ORDER — SODIUM CHLORIDE (PF) 0.9 % IJ SOLN
INTRAMUSCULAR | Status: AC
  Filled 2024-03-28: qty 20

## 2024-03-28 MED ORDER — DROPERIDOL 2.5 MG/ML IJ SOLN
INTRAMUSCULAR | Status: AC
  Filled 2024-03-28: qty 2

## 2024-03-28 MED ORDER — AMISULPRIDE (ANTIEMETIC) 5 MG/2ML IV SOLN
INTRAVENOUS | Status: AC
  Filled 2024-03-28: qty 4

## 2024-03-28 MED ORDER — EPINEPHRINE PF 1 MG/ML IJ SOLN
INTRAMUSCULAR | Status: AC
  Filled 2024-03-28: qty 1

## 2024-03-28 MED ORDER — SODIUM CHLORIDE 0.9% FLUSH: 3.0000 mL | Freq: Two times a day (BID) | INTRAVENOUS | Status: DC

## 2024-03-28 MED ORDER — CEFAZOLIN SODIUM-DEXTROSE 2-4 GM/100ML-% IV SOLN
INTRAVENOUS | Status: AC
  Filled 2024-03-28: qty 100

## 2024-03-28 MED ORDER — ACETAMINOPHEN 500 MG PO TABS: 1000.0000 mg | ORAL_TABLET | Freq: Once | ORAL | Status: DC

## 2024-03-28 MED ORDER — LIDOCAINE-EPINEPHRINE 1 %-1:100000 IJ SOLN
INTRAMUSCULAR | Status: DC | PRN
  Administered 2024-03-28: 09:00:00 20 mL

## 2024-03-28 MED ORDER — ACETAMINOPHEN 325 MG RE SUPP: 650.0000 mg | RECTAL | Status: DC | PRN

## 2024-03-28 MED ORDER — LIDOCAINE 2% (20 MG/ML) 5 ML SYRINGE
INTRAMUSCULAR | Status: DC | PRN
  Administered 2024-03-28: 09:00:00 60 mg via INTRAVENOUS

## 2024-03-28 MED ORDER — ONDANSETRON HCL 4 MG/2ML IJ SOLN
INTRAMUSCULAR | Status: DC | PRN
  Administered 2024-03-28: 10:00:00 4 mg via INTRAVENOUS

## 2024-03-28 MED ORDER — LIDOCAINE HCL (PF) 1 % IJ SOLN
INTRAMUSCULAR | Status: AC
  Filled 2024-03-28: qty 60

## 2024-03-28 MED ORDER — ROCURONIUM BROMIDE 10 MG/ML (PF) SYRINGE
PREFILLED_SYRINGE | INTRAVENOUS | Status: AC
  Filled 2024-03-28: qty 10

## 2024-03-28 MED ORDER — EPINEPHRINE PF 1 MG/ML IJ SOLN
INTRAMUSCULAR | Status: DC | PRN
  Administered 2024-03-28: 09:00:00 450 mL

## 2024-03-28 MED ORDER — BUPIVACAINE LIPOSOME 1.3 % IJ SUSP
INTRAMUSCULAR | Status: AC
  Filled 2024-03-28: qty 20

## 2024-03-28 MED ORDER — LIDOCAINE 2% (20 MG/ML) 5 ML SYRINGE
INTRAMUSCULAR | Status: AC
  Filled 2024-03-28: qty 5

## 2024-03-28 MED ORDER — CEFAZOLIN SODIUM-DEXTROSE 2-4 GM/100ML-% IV SOLN
2.0000 g | INTRAVENOUS | Status: AC
  Administered 2024-03-28: 09:00:00 2 g via INTRAVENOUS

## 2024-03-28 MED ORDER — ROCURONIUM BROMIDE 100 MG/10ML IV SOLN
INTRAVENOUS | Status: DC | PRN
  Administered 2024-03-28: 09:00:00 60 mg via INTRAVENOUS

## 2024-03-28 MED ORDER — FENTANYL CITRATE (PF) 100 MCG/2ML IJ SOLN: 25.0000 ug | INTRAMUSCULAR | Status: DC | PRN

## 2024-03-28 MED ORDER — BUPIVACAINE LIPOSOME 1.3 % IJ SUSP
INTRAMUSCULAR | Status: DC | PRN
  Administered 2024-03-28: 10:00:00 40 mL

## 2024-03-28 MED ORDER — VASHE WOUND IRRIGATION OPTIME
TOPICAL | Status: DC | PRN
  Administered 2024-03-28: 08:00:00 34 [oz_av]

## 2024-03-28 MED ORDER — FENTANYL CITRATE (PF) 100 MCG/2ML IJ SOLN
INTRAMUSCULAR | Status: AC
  Filled 2024-03-28: qty 2

## 2024-03-28 MED ORDER — LIDOCAINE-EPINEPHRINE 1 %-1:100000 IJ SOLN
INTRAMUSCULAR | Status: AC
  Filled 2024-03-28: qty 1

## 2024-03-28 MED ORDER — SODIUM CHLORIDE 0.9 % IV SOLN: 250.0000 mL | INTRAVENOUS | Status: DC | PRN

## 2024-03-28 MED ORDER — ACETAMINOPHEN 10 MG/ML IV SOLN
INTRAVENOUS | Status: AC
  Filled 2024-03-28: qty 100

## 2024-03-28 MED ORDER — SODIUM CHLORIDE 0.9% FLUSH: 3.0000 mL | INTRAVENOUS | Status: DC | PRN

## 2024-03-28 MED ORDER — DEXAMETHASONE SOD PHOSPHATE PF 10 MG/ML IJ SOLN
INTRAMUSCULAR | Status: DC | PRN
  Administered 2024-03-28: 09:00:00 10 mg via INTRAVENOUS

## 2024-03-28 MED ORDER — DEXMEDETOMIDINE HCL IN NACL 80 MCG/20ML IV SOLN
INTRAVENOUS | Status: AC
  Filled 2024-03-28: qty 20

## 2024-03-28 MED ADMIN — Sugammadex Sodium IV 200 MG/2ML (Base Equivalent): 200 mg | INTRAVENOUS | @ 10:00:00 | NDC 99999070036

## 2024-03-28 MED ADMIN — Phenylephrine HCl IV Soln 10 MG/ML: 80 ug | INTRAVENOUS | @ 09:00:00 | NDC 00641614225

## 2024-03-28 MED FILL — Phenylephrine-NaCl Pref Syr 0.8 MG/10ML-0.9% (80 MCG/ML): INTRAVENOUS | Qty: 10 | Status: AC

## 2024-03-28 SURGICAL SUPPLY — 67 items
BINDER BREAST LRG (GAUZE/BANDAGES/DRESSINGS) IMPLANT
BINDER BREAST MEDIUM (GAUZE/BANDAGES/DRESSINGS) IMPLANT
BINDER BREAST XLRG (GAUZE/BANDAGES/DRESSINGS) IMPLANT
BINDER BREAST XXLRG (GAUZE/BANDAGES/DRESSINGS) IMPLANT
BIOPATCH RED 1 DISK 7.0 (GAUZE/BANDAGES/DRESSINGS) IMPLANT
BLADE HEX COATED 2.75 (ELECTRODE) IMPLANT
BLADE KNIFE PERSONA 10 (BLADE) ×2 IMPLANT
BLADE SURG 15 STRL LF DISP TIS (BLADE) ×1 IMPLANT
CANISTER SUCT 1200ML W/VALVE (MISCELLANEOUS) ×1 IMPLANT
CLEANSER WND VASHE 34 (WOUND CARE) ×1 IMPLANT
COTTONBALL LRG STERILE PKG (GAUZE/BANDAGES/DRESSINGS) IMPLANT
COVER BACK TABLE 60X90IN (DRAPES) ×1 IMPLANT
COVER MAYO STAND STRL (DRAPES) ×1 IMPLANT
DERMABOND ADVANCED .7 DNX12 (GAUZE/BANDAGES/DRESSINGS) ×2 IMPLANT
DRAIN CHANNEL 15F RND FF W/TCR (WOUND CARE) IMPLANT
DRAIN CHANNEL 19F RND (DRAIN) IMPLANT
DRAPE LAPAROSCOPIC ABDOMINAL (DRAPES) ×1 IMPLANT
DRAPE UTILITY XL STRL (DRAPES) ×1 IMPLANT
DRSG MEPILEX POST OP 4X8 (GAUZE/BANDAGES/DRESSINGS) ×2 IMPLANT
DRSG TEGADERM 4X4.75 (GAUZE/BANDAGES/DRESSINGS) IMPLANT
DRSG TELFA 3X8 NADH STRL (GAUZE/BANDAGES/DRESSINGS) IMPLANT
ELECTRODE BLDE 4.0 EZ CLN MEGD (MISCELLANEOUS) ×1 IMPLANT
ELECTRODE REM PT RTRN 9FT ADLT (ELECTROSURGICAL) ×1 IMPLANT
EVACUATOR SILICONE 100CC (DRAIN) IMPLANT
GAUZE PAD ABD 8X10 STRL (GAUZE/BANDAGES/DRESSINGS) ×2 IMPLANT
GAUZE XEROFORM 5X9 LF (GAUZE/BANDAGES/DRESSINGS) IMPLANT
GLOVE BIO SURGEON STRL SZ 6.5 (GLOVE) ×2 IMPLANT
GLOVE BIO SURGEON STRL SZ7.5 (GLOVE) IMPLANT
GLOVE BIOGEL PI IND STRL 7.0 (GLOVE) IMPLANT
GLOVE BIOGEL PI IND STRL 8 (GLOVE) IMPLANT
GOWN STRL REUS W/ TWL LRG LVL3 (GOWN DISPOSABLE) ×1 IMPLANT
GOWN STRL REUS W/ TWL XL LVL3 (GOWN DISPOSABLE) IMPLANT
GOWN STRL REUS W/TWL XL LVL3 (GOWN DISPOSABLE) ×1 IMPLANT
LINER CANISTER 1000CC FLEX (MISCELLANEOUS) ×1 IMPLANT
NDL FILTER BLUNT 18X1 1/2 (NEEDLE) IMPLANT
NDL HYPO 25X1 1.5 SAFETY (NEEDLE) ×2 IMPLANT
NEEDLE FILTER BLUNT 18X1 1/2 (NEEDLE) IMPLANT
NEEDLE HYPO 25X1 1.5 SAFETY (NEEDLE) ×2 IMPLANT
PACK BASIN DAY SURGERY FS (CUSTOM PROCEDURE TRAY) ×1 IMPLANT
PAD ALCOHOL SWAB (MISCELLANEOUS) IMPLANT
PAD FOAM SILICONE BACKED (GAUZE/BANDAGES/DRESSINGS) IMPLANT
PENCIL SMOKE EVACUATOR (MISCELLANEOUS) ×1 IMPLANT
PIN SAFETY STERILE (MISCELLANEOUS) IMPLANT
POWDER MYRIAD MORCLLS FINE 500 (Miscellaneous) IMPLANT
SLEEVE SCD COMPRESS KNEE MED (STOCKING) ×1 IMPLANT
SOL PREP POV-IOD 4OZ 10% (MISCELLANEOUS) ×1 IMPLANT
SOLN 0.9% NACL POUR BTL 1000ML (IV SOLUTION) IMPLANT
SPIKE FLUID TRANSFER (MISCELLANEOUS) IMPLANT
SPONGE T-LAP 18X18 ~~LOC~~+RFID (SPONGE) ×2 IMPLANT
STRIP SUTURE WOUND CLOSURE 1/2 (MISCELLANEOUS) ×4 IMPLANT
SUT MNCRL AB 4-0 PS2 18 (SUTURE) ×4 IMPLANT
SUT MON AB 3-0 SH27 (SUTURE) ×4 IMPLANT
SUT MON AB 5-0 PS2 18 (SUTURE) IMPLANT
SUT PDS II 3-0 CT2 27 ABS (SUTURE) ×4 IMPLANT
SUT SILK 3 0 PS 1 (SUTURE) IMPLANT
SUT VIC AB 4-0 PS2 27 (SUTURE) IMPLANT
SYR 50ML LL SCALE MARK (SYRINGE) IMPLANT
SYR BULB IRRIG 60ML STRL (SYRINGE) ×1 IMPLANT
SYR CONTROL 10ML LL (SYRINGE) ×2 IMPLANT
TAPE MEASURE VINYL STERILE (MISCELLANEOUS) IMPLANT
TOWEL GREEN STERILE FF (TOWEL DISPOSABLE) ×3 IMPLANT
TRAY DSU PREP LF (CUSTOM PROCEDURE TRAY) ×1 IMPLANT
TUBE CONNECTING 20X1/4 (TUBING) ×1 IMPLANT
TUBING INFILTRATION IT-10001 (TUBING) IMPLANT
TUBING SET GRADUATE ASPIR 12FT (MISCELLANEOUS) IMPLANT
UNDERPAD 30X36 HEAVY ABSORB (UNDERPADS AND DIAPERS) ×2 IMPLANT
YANKAUER SUCT BULB TIP NO VENT (SUCTIONS) ×1 IMPLANT

## 2024-03-28 NOTE — Interval H&P Note (Signed)
 History and Physical Interval Note:  03/28/2024 8:18 AM  Deanna Reilly  has presented today for surgery, with the diagnosis of Hypertrophy of breast.  The various methods of treatment have been discussed with the patient and family. After consideration of risks, benefits and other options for treatment, the patient has consented to  Procedures: BREAST REDUCTION WITH LIPOSUCTION (Bilateral) as a surgical intervention.  The patient's history has been reviewed, patient examined, no change in status, stable for surgery.  I have reviewed the patient's chart and labs.  Questions were answered to the patient's satisfaction.     Estefana RAMAN [name]

## 2024-03-28 NOTE — Transfer of Care (Signed)
 Immediate Anesthesia Transfer of Care Note  Patient: Deanna Reilly  Procedure(s) Performed: BREAST REDUCTION WITH LIPOSUCTION (Bilateral: Breast)  Patient Location: PACU  Anesthesia Type:General  Level of Consciousness: drowsy  Airway & Oxygen Therapy: Patient Spontanous Breathing and Patient connected to face mask oxygen  Post-op Assessment: Report given to RN and Post -op Vital signs reviewed and stable  Post vital signs: Reviewed and stable  Last Vitals:  Vitals Value Taken Time  BP 139/84 (100)   Temp    Pulse 83 03/28/24 10:25  Resp 13 03/28/24 10:25  SpO2 100 % 03/28/24 10:25  Vitals shown include unfiled device data.  Last Pain:  Vitals:   03/28/24 0705  TempSrc: Temporal  PainSc: 0-No pain      Patients Stated Pain Goal: 3 (03/28/24 0705)  Complications: No notable events documented.

## 2024-03-28 NOTE — Discharge Instructions (Addendum)
 INSTRUCTIONS FOR AFTER BREAST SURGERY   You will likely have some questions about what to expect following your operation.  The following information will help you and your family understand what to expect when you are discharged from the hospital.  It is important to follow these guidelines to help ensure a smooth recovery and reduce complication.  Postoperative instructions include information on: diet, wound care, medications and physical activity.  AFTER SURGERY Expect to go home after the procedure.  In some cases, you may need to spend one night in the hospital for observation.  DIET Breast surgery does not require a specific diet.  However, the healthier you eat the better your body will heal. It is important to increasing your protein intake.  This means limiting the foods with sugar and carbohydrates.  Focus on vegetables and some meat.  If you have liposuction during your procedure be sure to drink water.  If your urine is bright yellow, then it is concentrated, and you need to drink more water.  As a general rule after surgery, you should have 8 ounces of water every hour while awake.  If you find you are persistently nauseated or unable to take in liquids let us  know.  NO TOBACCO USE or EXPOSURE.  This will slow your healing process and lead to a wound.  WOUND CARE Leave the binder on for 3 days . Use fragrance free soap like Dial, Dove or Ivory.   After 3 days you can remove the binder to shower. Once dry apply binder or sports bra. If you have liposuction you will have a soft and spongy dressing (Lipofoam) that helps prevent creases in your skin.  Remove before you shower and then replace it.  It is also available on [employer]. If you have steri-strips / tape directly attached to your skin leave them in place. It is OK to get these wet.   No baths, pools or hot tubs for four weeks. We close your incision to leave the smallest and best-looking scar. No ointment or creams on your incisions  for four weeks.  No Neosporin (Too many skin reactions).  A few weeks after surgery you can use Mederma and start massaging the scar. We ask you to wear your binder or sports bra for the first 6 weeks around the clock, including while sleeping. This provides added comfort and helps reduce the fluid accumulation at the surgery site. NO Ice or heating pads to the operative site.  You have a very high risk of a BURN before you feel the temperature change.  ACTIVITY No heavy lifting until cleared by the doctor.  This usually means no more than a half-gallon of milk.  It is OK to walk and climb stairs. Moving your legs is very important to decrease your risk of a blood clot.  It will also help keep you from getting deconditioned.  Every 1 to 2 hours get up and walk for 5 minutes. This will help with a quicker recovery back to normal.  Let pain be your guide so you don't do too much.  This time is for you to recover.  You will be more comfortable if you sleep and rest with your head elevated either with a few pillows under you or in a recliner.  No stomach sleeping for a three months.  WORK Everyone returns to work at different times. As a rough guide, most people take at least 1 - 2 weeks off prior to returning to work. If  you need documentation for your job, give the forms to the front staff at the clinic.  DRIVING Arrange for someone to bring you home from the hospital after your surgery.  You may be able to drive a few days after surgery but not while taking any narcotics or valium.  BOWEL MOVEMENTS Constipation can occur after anesthesia and while taking pain medication.  It is important to stay ahead for your comfort.  We recommend taking Milk of Magnesia (2 tablespoons; twice a day) while taking the pain pills.  MEDICATIONS You may be prescribed should start after surgery At your preoperative visit for you history and physical you were given the following medications: Antibiotic: Start this  medication when you get home and take according to the instructions on the bottle. Zofran  4 mg:  This is to treat nausea and vomiting.  You can take this every 6 hours as needed and only if needed. Oxycodone  5 mg every 6 hours for 3 - 5 days.  This is to be used after you have taken the Motrin  or the Tylenol . 4.   Gabapentin 300 mg every 12 hours for 7 days.  Over the counter Medication to take: Ibuprofen  (Motrin ) 400 - 600 mg every 6 hour for 7 days Tylenol  500 mg every 6 hours for 7 days.  Only take the Oxycodone  after you have tried these two. MiraLAX  or stool softener of choice: Take this according to the bottle if you take the Norco.  If muscle work done:  Flexeril  5 mg every 12 hours for 7 days.  WHEN TO CALL Call your surgeon's office if any of the following occur: Fever 101 degrees F or greater Excessive bleeding or fluid from the incision site. Pain that increases over time without aid from the medications Redness, warmth, or pus draining from incision sites Persistent nausea or inability to take in liquids Severe misshapen area that underwent the operation.    Post Anesthesia Home Care Instructions  Activity: Get plenty of rest for the remainder of the day. A responsible individual must stay with you for 24 hours following the procedure.  For the next 24 hours, DO NOT: -Drive a car -Advertising copywriter -Drink alcoholic beverages -Take any medication unless instructed by your physician -Make any legal decisions or sign important papers.  Meals: Start with liquid foods such as gelatin or soup. Progress to regular foods as tolerated. Avoid greasy, spicy, heavy foods. If nausea and/or vomiting occur, drink only clear liquids until the nausea and/or vomiting subsides. Call your physician if vomiting continues.  Special Instructions/Symptoms: Your throat may feel dry or sore from the anesthesia or the breathing tube placed in your throat during surgery. If this causes  discomfort, gargle with warm salt water. The discomfort should disappear within 24 hours.  If you had a scopolamine patch placed behind your ear for the management of post- operative nausea and/or vomiting:  1. The medication in the patch is effective for 72 hours, after which it should be removed.  Wrap patch in a tissue and discard in the trash. Wash hands thoroughly with soap and water. 2. You may remove the patch earlier than 72 hours if you experience unpleasant side effects which may include dry mouth, dizziness or visual disturbances. 3. Avoid touching the patch. Wash your hands with soap and water after contact with the patch.    Post Anesthesia Home Care Instructions  Activity: Get plenty of rest for the remainder of the day. A responsible individual must stay  with you for 24 hours following the procedure.  For the next 24 hours, DO NOT: -Drive a car -Advertising copywriter -Drink alcoholic beverages -Take any medication unless instructed by your physician -Make any legal decisions or sign important papers.  Meals: Start with liquid foods such as gelatin or soup. Progress to regular foods as tolerated. Avoid greasy, spicy, heavy foods. If nausea and/or vomiting occur, drink only clear liquids until the nausea and/or vomiting subsides. Call your physician if vomiting continues.  Special Instructions/Symptoms: Your throat may feel dry or sore from the anesthesia or the breathing tube placed in your throat during surgery. If this causes discomfort, gargle with warm salt water. The discomfort should disappear within 24 hours.  Next dose of Tylenol  can be taken 2pm today if needed.

## 2024-03-28 NOTE — Anesthesia Preprocedure Evaluation (Addendum)
 Anesthesia Evaluation  Patient identified by MRN, date of birth, ID band Patient awake    Reviewed: Allergy & Precautions, H&P , NPO status , Patient's Chart, lab work & pertinent test results  Airway Mallampati: II  TM Distance: >3 FB Neck ROM: Full    Dental no notable dental hx. (+) Teeth Intact, Dental Advisory Given   Pulmonary asthma    Pulmonary exam normal breath sounds clear to auscultation       Cardiovascular negative cardio ROS  Rhythm:Regular Rate:Normal     Neuro/Psych   Anxiety     negative neurological ROS     GI/Hepatic negative GI ROS, Neg liver ROS,,,  Endo/Other  negative endocrine ROS    Renal/GU negative Renal ROS  negative genitourinary   Musculoskeletal   Abdominal   Peds  Hematology  (+) Blood dyscrasia, anemia   Anesthesia Other Findings   Reproductive/Obstetrics negative OB ROS                              Anesthesia Physical Anesthesia Plan  ASA: 2  Anesthesia Plan: General   Post-op Pain Management: Ofirmev  IV (intra-op)*   Induction: Intravenous  PONV Risk Score and Plan: 4 or greater and Ondansetron , Dexamethasone  and Midazolam   Airway Management Planned: Oral ETT  Additional Equipment:   Intra-op Plan:   Post-operative Plan: Extubation in OR  Informed Consent: I have reviewed the patients History and Physical, chart, labs and discussed the procedure including the risks, benefits and alternatives for the proposed anesthesia with the patient or authorized representative who has indicated his/her understanding and acceptance.     Dental advisory given  Plan Discussed with: CRNA  Anesthesia Plan Comments:          Anesthesia Quick Evaluation

## 2024-03-28 NOTE — Anesthesia Postprocedure Evaluation (Signed)
 Anesthesia Post Note  Patient: Deanna Reilly  Procedure(s) Performed: BREAST REDUCTION WITH LIPOSUCTION (Bilateral: Breast)     Patient location during evaluation: PACU Anesthesia Type: General Level of consciousness: awake and alert Pain management: pain level controlled Vital Signs Assessment: post-procedure vital signs reviewed and stable Respiratory status: spontaneous breathing, nonlabored ventilation and respiratory function stable Cardiovascular status: blood pressure returned to baseline and stable Postop Assessment: no apparent nausea or vomiting Anesthetic complications: no   No notable events documented.  Last Vitals:  Vitals:   03/28/24 1100 03/28/24 1125  BP: 113/72 123/79  Pulse: 91 96  Resp: 13   Temp:  36.7 C  SpO2: 99% 100%    Last Pain:  Vitals:   03/28/24 1125  TempSrc:   PainSc: 0-No pain                 [place],W. EDMOND

## 2024-03-28 NOTE — Anesthesia Procedure Notes (Signed)
 Procedure Name: Intubation Date/Time: 03/28/2024 8:33 AM  Performed by: Debarah Chiquita LABOR, CRNAPre-anesthesia Checklist: Patient identified, Emergency Drugs available, Suction available and Patient being monitored Patient Re-evaluated:Patient Re-evaluated prior to induction Oxygen Delivery Method: Circle system utilized Preoxygenation: Pre-oxygenation with 100% oxygen Induction Type: IV induction Ventilation: Mask ventilation without difficulty Laryngoscope Size: Mac and 3 Grade View: Grade I Tube type: Oral Tube size: 7.0 mm Number of attempts: 1 Airway Equipment and Method: Stylet and Bite block Placement Confirmation: ETT inserted through vocal cords under direct vision, positive ETCO2 and breath sounds checked- equal and bilateral Secured at: 22 cm Tube secured with: Tape Dental Injury: Teeth and Oropharynx as per pre-operative assessment

## 2024-03-28 NOTE — Op Note (Signed)
 Breast Reduction Op note:    DATE OF PROCEDURE: 03/28/2024  LOCATION: [facility]  SURGEON: Estefana Fritter, DO  ASSISTANT: Estefana Peck, PA  PREOPERATIVE DIAGNOSIS 1. Macromastia 2. Neck Pain 3. Back Pain  POSTOPERATIVE DIAGNOSIS 1. Macromastia 2. Neck Pain 3. Back Pain  PROCEDURES 1. Bilateral breast reduction.  Right reduction 489 g, Left reduction 540 g  COMPLICATIONS: None.  DRAINS: none  INDICATIONS FOR PROCEDURE Deanna Reilly is a 23 y.o. year-old female born on 11-Oct-2000,with a history of symptomatic macromastia with concomitant back pain, neck pain, shoulder grooving from her bra.   MRN: [id number]  CONSENT Informed consent was obtained directly from the patient. The risks, benefits and alternatives were fully discussed. Specific risks including but not limited to bleeding, infection, hematoma, seroma, scarring, pain, nipple necrosis, asymmetry, poor cosmetic results, and need for further surgery were discussed. The patient's questions were answered.  DESCRIPTION OF PROCEDURE  Patient was brought into the operating room and rested on the operating room table in the supine position.  SCDs were placed and appropriate padding was performed.  Antibiotics were given. The patient underwent general anesthesia and the chest was prepped and draped in a sterile fashion.  A timeout was performed and all information was confirmed to be correct by those in the room. Tumescent was placed in the lateral breast area and liposuction done laterally on each breast.   Right side: Preoperative markings were confirmed.  Incision lines were injected with local containing epinephrine .  After waiting for vasoconstriction, the marked lines were incised with a #15 blade.  A lollipop type superomedial breast reduction was performed by de-epithelializing the pedicle, using bovie to create the superomedial pedicle, and removing breast tissue from the superior,  lateral, and inferior portions of the breast.  Care was taken to not undermine the breast pedicle. Hemostasis was achieved.  Experel and myriad were placed in the pocket. he nipple was gently rotated into position and the soft tissue closed with 4-0 Monocryl.   The pocket was irrigated and hemostasis confirmed.  The deep tissues were approximated with 3-0 PDS sutures.  The skin was closed with deep dermal 3-0 Monocryl and subcuticular 4-0 Monocryl sutures.  The nipple and skin flaps had good capillary refill at the end of the procedure.    Left side: Preoperative markings were confirmed.  Incision lines were injected with local containing epinephrine .  After waiting for vasoconstriction, the marked lines were incised with a #15 blade.  A lollipop type superomedial breast reduction was performed by de-epithelializing the pedicle, using bovie to create the superomedial pedicle, and removing breast tissue from the superior, lateral, and inferior portions of the breast.  Care was taken to not undermine the breast pedicle. Hemostasis was achieved.  The experel and myriad were placed in the pocket. The nipple was gently rotated into position and the soft tissue was closed with 4-0 Monocryl.  The patient was sat upright and size and shape symmetry was confirmed.  The pocket was irrigated and hemostasis confirmed.  The deep tissues were approximated with 3-0 PDS sutures. The skin was closed with deep dermal 3-0 Monocryl and subcuticular 4-0 Monocryl sutures.  Dermabond was applied.  A breast binder and ABDs were placed.  The nipple and skin flaps had good capillary refill at the end of the procedure.  The patient tolerated the procedure well. The patient was allowed to wake from anesthesia and taken to the recovery room in satisfactory condition.  The advanced practice practitioner (  APP) assisted throughout the case.  The APP was essential in retraction and counter traction when needed to make the case progress  smoothly.  This retraction and assistance made it possible to see the tissue plans for the procedure.  The assistance was needed for blood control, tissue re-approximation and assisted with closure of the incision site.

## 2024-03-29 ENCOUNTER — Telehealth: Payer: Self-pay | Admitting: Plastic Surgery

## 2024-03-29 NOTE — Telephone Encounter (Signed)
 Pt called and stated that she took the antibiotic at 130 she is now experiencing rapid heart rate face flushing with a temp of 99.8.  Please advise

## 2024-03-30 LAB — SURGICAL PATHOLOGY

## 2024-03-30 NOTE — Telephone Encounter (Signed)
 I called the patient about her questions. She wanted to know if she can take her Zofran  and Tylenol  at the same time. I told her this was fine. She stated she has been experiencing a fever since yesterday between 99.2 and 100.4 but the Tylenol  has been helping. She denies unilateral leg swelling, chest pain, or shortness of breath. I answered all of her questions to her satisfaction and told her she can call or send a MyChart message if she thinks of anything else.

## 2024-03-30 NOTE — Telephone Encounter (Signed)
 Pt is calling again and has more questions about mixing medications, please call pt Pt states she has a fever

## 2024-03-30 NOTE — Telephone Encounter (Signed)
 I called the patient to talk with her about her concerns. She said she felt slightly short of breath when she was doing her daily walk, but she is feeling better now. I told her if she starts to experience shortness of breath again, our recommendation would be to be evaluated at the ED or urgent care. Patient conveyed understanding. She had another question about her one breast. She stated one felt like there was more fluid in it. I asked if she was experiencing redness, swelling, or firmness to that breast. The patient didn't think so but she was going to take the binder off to check.

## 2024-04-03 ENCOUNTER — Encounter: Payer: Self-pay | Admitting: Family

## 2024-04-03 ENCOUNTER — Encounter: Payer: Self-pay | Admitting: Plastic Surgery

## 2024-04-03 ENCOUNTER — Ambulatory Visit (INDEPENDENT_AMBULATORY_CARE_PROVIDER_SITE_OTHER): Admitting: Plastic Surgery

## 2024-04-03 VITALS — BP 111/73 | HR 66 | Temp 98.8°F

## 2024-04-03 DIAGNOSIS — N62 Hypertrophy of breast: Secondary | ICD-10-CM

## 2024-04-03 NOTE — Progress Notes (Signed)
 The patient is a 23 year old female here for follow-up after undergoing bilateral breast reduction.  She had her surgery on December 17.  She had 489 g removed from the right breast and 540 g removed from the left breast.  He is doing great and very pleased with her results.  Continue with the lipo foam and binder.  She can go into a sports bra if she wants.  Pictures at next exam.

## 2024-04-04 ENCOUNTER — Telehealth: Payer: Self-pay | Admitting: Plastic Surgery

## 2024-04-04 NOTE — Telephone Encounter (Signed)
 Returned patients call. She recently had breast reduction on 03/28/2024 with Dr Lowery.  She has been having a tingling and numbing sensation for the past 2 days, especially at night.  She did call her PCP and was advised that she is not moving around enough.  Patient did sleep in a recliner with her legs up which helped somewhat. Strongly suggested for her to go an Urgent care for further testing/workup.  Patient understood and agreed with plan.

## 2024-04-04 NOTE — Telephone Encounter (Signed)
 Pt called and stated that for the past 2 months at night she has been experiencing numbness and tingling in her legs when she is trying to go to sleep, has not reached out to PCP.  Please advise.

## 2024-04-05 ENCOUNTER — Emergency Department (HOSPITAL_COMMUNITY)
Admission: EM | Admit: 2024-04-05 | Discharge: 2024-04-05 | Discharge status: Against Medical Advice | Attending: Emergency Medicine | Admitting: Emergency Medicine

## 2024-04-05 DIAGNOSIS — R509 Fever, unspecified: Secondary | ICD-10-CM | POA: Insufficient documentation

## 2024-04-05 DIAGNOSIS — M7989 Other specified soft tissue disorders: Secondary | ICD-10-CM | POA: Insufficient documentation

## 2024-04-05 DIAGNOSIS — R0602 Shortness of breath: Secondary | ICD-10-CM | POA: Insufficient documentation

## 2024-04-05 DIAGNOSIS — Z5321 Procedure and treatment not carried out due to patient leaving prior to being seen by health care provider: Secondary | ICD-10-CM | POA: Insufficient documentation

## 2024-04-05 NOTE — ED Notes (Signed)
 Patient stated she was just going to leave and left stickers with me and walked out the door.

## 2024-04-05 NOTE — ED Triage Notes (Signed)
 Pt to er, pt states that she is here for some shortness of breath, fever and she feels like her heart is racing, states that she has a breast reduction last week.  Pt states that she is also having some swelling in her L leg.

## 2024-04-10 ENCOUNTER — Ambulatory Visit (INDEPENDENT_AMBULATORY_CARE_PROVIDER_SITE_OTHER): Admitting: Student

## 2024-04-10 ENCOUNTER — Encounter: Payer: Self-pay | Admitting: Student

## 2024-04-10 VITALS — BP 115/69 | HR 59 | Ht 61.5 in | Wt 145.0 lb

## 2024-04-10 DIAGNOSIS — N62 Hypertrophy of breast: Secondary | ICD-10-CM

## 2024-04-10 NOTE — Progress Notes (Signed)
 Patient is a 23 year old female who recently had bilateral breast reduction with Dr. Lowery on 03/28/2024.  Patient is about 2 weeks postop.  She presents to the clinic today for postoperative follow-up.  Patient was last seen in the clinic on 04/03/2024.  At this visit, patient was doing great and very pleased with her results.  Today, patient reports she is doing well.  She states that she had a fever 1 time last week which was 101.8 F.  She states that she has not had a fever since.  She also states that her left ankle was swollen at that time, but it has since resolved.  She denies any other issues or concerns at this time.  She denies any chest pain or shortness of breath.  Chaperone present on exam.  On exam, patient is sitting upright in no acute distress.  Breasts are overall soft and symmetric.  There is no overlying erythema, no obvious fluid collections on exam.  There is a little bit of firmness noted to the lateral breast bilaterally consistent with scar tissue, possibly fat necrosis.  There are no overlying skin changes.  NAC's appear to be healthy bilaterally.  Steri-Strips are in place over the incisions bilaterally.  There are no signs of infection on exam.  Bilateral lower legs are symmetric and soft.  There is no overlying erythema, no tenderness to palpation to either lower leg.  Discussed with patient to continue with compression at all times and avoid strenuous activities.  Discussed with her that the Steri-Strips should fall off on their own.  Discussed with her that if she feels comfortable with it, she may clip them if they are peeling up.  Patient expressed understanding.  Encouraged the patient to continue to ambulate frequently.  Discussed with her to closely monitor her surgical sites and if she has any concerns to contact us .  Patient expressed understanding.  Patient to follow back up in about 2 to 3 weeks.  All of her questions were answered to her  satisfaction.  I instructed her to call if she has any questions or concerns about anything.

## 2024-04-13 ENCOUNTER — Encounter: Payer: Self-pay | Admitting: Family

## 2024-04-25 ENCOUNTER — Encounter: Payer: Self-pay | Admitting: Family

## 2024-05-03 ENCOUNTER — Ambulatory Visit: Admitting: Student

## 2024-05-03 ENCOUNTER — Encounter: Payer: Self-pay | Admitting: Student

## 2024-05-03 VITALS — BP 115/57 | HR 68 | Ht 61.0 in | Wt 145.6 lb

## 2024-05-03 DIAGNOSIS — N62 Hypertrophy of breast: Secondary | ICD-10-CM

## 2024-05-03 NOTE — Telephone Encounter (Signed)
 Completed.

## 2024-05-03 NOTE — Progress Notes (Signed)
 Patient is a 24 year old female who recently had bilateral breast reduction with Dr. Lowery on 03/28/2024.  Patient is about 5 weeks postop.  She presents to the clinic today for postoperative follow-up.  Today, patient reports she is doing well.  She states that sometimes her right NAC irritates her.  She also states that her left breast is slightly larger than her right.  She otherwise denies any issues or concerns.  She denies any fevers or chills.  Chaperone present on exam.  On exam, patient sitting upright in no acute distress. Breasts are overall soft and symmetric, left breast is slightly larger than the right.  There also is some scar tissue palpated to the bilateral breasts medially/laterally.  There is no overlying erythema.  No obvious fluid collection on exam.  Steri-Strips were removed from the incisions.  NAC's appear to be healthy.  Neither NAC looks irritated on exam today.  Incisions appear to be clean dry and intact and healing well.  There are no signs of infection on exam.  Recommended that patient apply Vaseline to her NAC's and throughout her incisions daily.  Discussed with her to gently massage her breasts.  Patient expressed understanding.  Discussed with the patient to continue to closely monitor her breasts and to continue with compression at all times.  Patient expressed understand.  Patient expressed understanding.  Patient to follow back up in about 2 to 3 weeks.  Instructed her to call with any questions or concerns in the meantime.  Pictures were obtained of the patient and placed in the chart with the patient's or guardian's permission.

## 2024-05-11 ENCOUNTER — Encounter: Payer: Self-pay | Admitting: Family

## 2024-05-16 ENCOUNTER — Other Ambulatory Visit: Payer: Self-pay | Admitting: Family

## 2024-05-16 DIAGNOSIS — E559 Vitamin D deficiency, unspecified: Secondary | ICD-10-CM

## 2024-05-16 DIAGNOSIS — R7309 Other abnormal glucose: Secondary | ICD-10-CM

## 2024-05-16 DIAGNOSIS — Z7187 Encounter for pediatric-to-adult transition counseling: Secondary | ICD-10-CM

## 2024-05-17 ENCOUNTER — Telehealth: Payer: Self-pay | Admitting: Plastic Surgery

## 2024-05-17 NOTE — Telephone Encounter (Signed)
 PostOP: Bil breast reduction / Ins / Sx 03-28-24 [facility] [id number]  Pt called asking if she can start wear a bra. Clinical please advise pt

## 2024-05-18 ENCOUNTER — Other Ambulatory Visit

## 2024-05-18 LAB — CBC WITH DIFFERENTIAL/PLATELET
Absolute Lymphocytes: 2495 {cells}/uL (ref 850–3900)
Absolute Monocytes: 460 {cells}/uL (ref 200–950)
Basophils Absolute: 32 {cells}/uL (ref 0–200)
Basophils Relative: 0.5 %
Eosinophils Absolute: 82 {cells}/uL (ref 15–500)
Eosinophils Relative: 1.3 %
HCT: 36.6 % (ref 35.9–46.0)
Hemoglobin: 12 g/dL (ref 11.7–15.5)
MCH: 27.8 pg (ref 27.0–33.0)
MCHC: 32.8 g/dL (ref 31.6–35.4)
MCV: 84.9 fL (ref 81.4–101.7)
MPV: 10.9 fL (ref 7.5–12.5)
Monocytes Relative: 7.3 %
Neutro Abs: 3232 {cells}/uL (ref 1500–7800)
Neutrophils Relative %: 51.3 %
Platelets: 262 10*3/uL (ref 140–400)
RBC: 4.31 Million/uL (ref 3.80–5.10)
RDW: 12.8 % (ref 11.0–15.0)
Total Lymphocyte: 39.6 %
WBC: 6.3 10*3/uL (ref 3.8–10.8)

## 2024-05-29 ENCOUNTER — Encounter: Admitting: Family

## 2024-05-30 ENCOUNTER — Encounter: Admitting: Student

## 2024-07-04 ENCOUNTER — Ambulatory Visit: Payer: Self-pay | Admitting: Family Medicine

## 2024-07-05 ENCOUNTER — Ambulatory Visit: Admitting: Family Medicine

## 2024-07-16 ENCOUNTER — Ambulatory Visit: Admitting: Neurology
